# Patient Record
Sex: Female | Born: 1960 | Race: White | Hispanic: No | Marital: Married | Smoking: Current every day smoker
Health system: Southern US, Community
[De-identification: ages and names within clinical notes are randomized; demographics above are authoritative.]

## PROBLEM LIST (undated history)

## (undated) DIAGNOSIS — Z720 Tobacco use: Secondary | ICD-10-CM

## (undated) DIAGNOSIS — J329 Chronic sinusitis, unspecified: Secondary | ICD-10-CM

## (undated) DIAGNOSIS — R609 Edema, unspecified: Secondary | ICD-10-CM

## (undated) DIAGNOSIS — R600 Localized edema: Secondary | ICD-10-CM

## (undated) DIAGNOSIS — H6690 Otitis media, unspecified, unspecified ear: Secondary | ICD-10-CM

## (undated) HISTORY — PX: SHOULDER SURGERY: SHX246

## (undated) HISTORY — PX: KNEE SURGERY: SHX244

## (undated) HISTORY — PX: CHOLECYSTECTOMY: SHX55

## (undated) HISTORY — DX: Tobacco use: Z72.0

---

## 1999-10-11 ENCOUNTER — Encounter: Payer: Self-pay | Admitting: Family Medicine

## 1999-10-11 ENCOUNTER — Encounter: Admission: RE | Admit: 1999-10-11 | Discharge: 1999-10-11 | Payer: Self-pay | Admitting: Family Medicine

## 1999-10-19 ENCOUNTER — Encounter: Payer: Self-pay | Admitting: General Surgery

## 1999-10-19 ENCOUNTER — Observation Stay (HOSPITAL_COMMUNITY): Admission: RE | Admit: 1999-10-19 | Discharge: 1999-10-20 | Payer: Self-pay | Admitting: General Surgery

## 2000-03-10 ENCOUNTER — Ambulatory Visit (HOSPITAL_COMMUNITY): Admission: RE | Admit: 2000-03-10 | Discharge: 2000-03-10 | Payer: Self-pay | Admitting: General Surgery

## 2000-03-10 ENCOUNTER — Encounter: Payer: Self-pay | Admitting: General Surgery

## 2002-07-11 ENCOUNTER — Encounter: Admission: RE | Admit: 2002-07-11 | Discharge: 2002-07-11 | Payer: Self-pay | Admitting: Gastroenterology

## 2002-07-11 ENCOUNTER — Encounter: Payer: Self-pay | Admitting: Gastroenterology

## 2002-07-29 ENCOUNTER — Ambulatory Visit (HOSPITAL_COMMUNITY): Admission: RE | Admit: 2002-07-29 | Discharge: 2002-07-29 | Payer: Self-pay | Admitting: Gastroenterology

## 2002-10-23 ENCOUNTER — Encounter: Admission: RE | Admit: 2002-10-23 | Discharge: 2002-10-23 | Payer: Self-pay | Admitting: Internal Medicine

## 2002-10-23 ENCOUNTER — Encounter: Payer: Self-pay | Admitting: Internal Medicine

## 2003-03-18 ENCOUNTER — Encounter: Admission: RE | Admit: 2003-03-18 | Discharge: 2003-03-18 | Payer: Self-pay | Admitting: Gastroenterology

## 2003-03-18 ENCOUNTER — Encounter: Payer: Self-pay | Admitting: Gastroenterology

## 2003-05-30 ENCOUNTER — Ambulatory Visit (HOSPITAL_BASED_OUTPATIENT_CLINIC_OR_DEPARTMENT_OTHER): Admission: RE | Admit: 2003-05-30 | Discharge: 2003-05-30 | Payer: Self-pay | Admitting: Orthopedic Surgery

## 2005-05-17 ENCOUNTER — Encounter: Admission: RE | Admit: 2005-05-17 | Discharge: 2005-05-17 | Payer: Self-pay | Admitting: Internal Medicine

## 2005-08-26 ENCOUNTER — Ambulatory Visit (HOSPITAL_COMMUNITY): Admission: RE | Admit: 2005-08-26 | Discharge: 2005-08-26 | Payer: Self-pay | Admitting: *Deleted

## 2006-05-23 ENCOUNTER — Ambulatory Visit: Payer: Self-pay | Admitting: Oncology

## 2006-07-12 ENCOUNTER — Ambulatory Visit: Payer: Self-pay | Admitting: Oncology

## 2006-07-14 ENCOUNTER — Ambulatory Visit (HOSPITAL_BASED_OUTPATIENT_CLINIC_OR_DEPARTMENT_OTHER): Admission: RE | Admit: 2006-07-14 | Discharge: 2006-07-14 | Payer: Self-pay | Admitting: Orthopedic Surgery

## 2006-09-13 ENCOUNTER — Ambulatory Visit: Payer: Self-pay | Admitting: Oncology

## 2011-12-14 ENCOUNTER — Encounter (HOSPITAL_COMMUNITY): Payer: Self-pay | Admitting: Emergency Medicine

## 2011-12-14 ENCOUNTER — Emergency Department (HOSPITAL_COMMUNITY)
Admission: EM | Admit: 2011-12-14 | Discharge: 2011-12-14 | Disposition: A | Discharge status: Home / Self Care | Payer: Self-pay | Source: Home / Self Care | Attending: Emergency Medicine | Admitting: Emergency Medicine

## 2011-12-14 DIAGNOSIS — J329 Chronic sinusitis, unspecified: Secondary | ICD-10-CM

## 2011-12-14 DIAGNOSIS — J40 Bronchitis, not specified as acute or chronic: Secondary | ICD-10-CM

## 2011-12-14 HISTORY — DX: Localized edema: R60.0

## 2011-12-14 HISTORY — DX: Edema, unspecified: R60.9

## 2011-12-14 HISTORY — DX: Otitis media, unspecified, unspecified ear: H66.90

## 2011-12-14 HISTORY — DX: Chronic sinusitis, unspecified: J32.9

## 2011-12-14 MED ORDER — FLUTICASONE PROPIONATE 50 MCG/ACT NA SUSP: 2.0000 | Freq: Every day | NASAL | Status: DC

## 2011-12-14 MED ORDER — ALBUTEROL SULFATE HFA 108 (90 BASE) MCG/ACT IN AERS: 1.0000 | INHALATION_SPRAY | Freq: Four times a day (QID) | RESPIRATORY_TRACT | Status: DC | PRN

## 2011-12-14 MED ORDER — DEXAMETHASONE 4 MG PO TABS: 16.0000 mg | ORAL_TABLET | Freq: Two times a day (BID) | ORAL | Status: DC

## 2011-12-14 MED ORDER — DOXYCYCLINE HYCLATE 100 MG PO CAPS: 100.0000 mg | ORAL_CAPSULE | Freq: Two times a day (BID) | ORAL | Status: AC

## 2011-12-14 MED ORDER — DEXAMETHASONE 4 MG PO TABS: ORAL_TABLET | ORAL | Status: DC

## 2011-12-14 MED ORDER — HYDROCODONE-ACETAMINOPHEN 7.5-500 MG/15ML PO SOLN: 5.0000 mL | Freq: Four times a day (QID) | ORAL | Status: AC | PRN

## 2011-12-14 NOTE — ED Notes (Signed)
C/o of cold type symptoms  Since mothers day. Treated 3 weeks ago for a sinus infection

## 2011-12-14 NOTE — Discharge Instructions (Signed)
Take the medication as written. Return if you get worse, have a fever >100.4, or for any concerns. You may take 600 mg of motrin with 1 gram of tylenol up to 4 times a day as needed for pain. This is an effective combination for pain. Use a neti pot or the NeilMed sinus rinse as often as you want to to reduce nasal congestion. Follow the directions on the box.  ° °Go to www.goodrx.com to look up your medications. This will give you a list of where you can find your prescriptions at the most affordable prices.  °

## 2011-12-18 NOTE — ED Provider Notes (Signed)
History     CSN: [id number]  Arrival date & time 12/14/11  1955   First MD Initiated Contact with Patient 12/14/11 2010      Chief Complaint  Patient presents with  . URI    (Consider location/radiation/quality/duration/timing/severity/associated sxs/prior treatment) HPI Comments: Patient reports rhinorrhea, nasal congestion, frontal sinus headache, sinus pain/pressure, postnasal drip, sore, irritated throat starting 5 days ago. Also with chest congestion, coughing, wheezing, some shortness of breath with exertion. Difficulty sleeping at night due to coughing. No ear pain. No nausea, vomiting, chest pain.Marland Kitchen No photophobia, visual changes, neck stiffness. No fevers at home. No aggravating or alleviating factors. Has been trying over-the-counter antihistamines and cold medications without relief. She was recently treated for sinus infection, finished antibiotics 3 weeks ago. States she felt like she never got better. States this feels identical to this recent episode. Patient is a smoker.  ROS as noted in HPI. All other ROS negative.    Patient is a 51 y.o. female presenting with URI. The history is provided by the patient.  URI    Past Medical History  Diagnosis Date  . Peripheral edema   . Sinusitis   . Otitis media     Past Surgical History  Procedure Date  . Cholecystectomy   . Shoulder surgery   . Knee surgery     History reviewed. No pertinent family history.  History  Substance Use Topics  . Smoking status: Current Everyday Smoker -- 1.0 packs/day  . Smokeless tobacco: Not on file  . Alcohol Use: Yes    OB History    Grav Para Term Preterm Abortions TAB SAB Ect Mult Living                  Review of Systems  Allergies  Review of patient's allergies indicates no known allergies.  Home Medications   Current Outpatient Rx  Name Route Sig Dispense Refill  . FUROSEMIDE 20 MG PO TABS Oral Take 20 mg by mouth 2 (two) times daily.    Marland Kitchen  PSEUDOEPHEDRINE-GUAIFENESIN ER [phone] MG PO TB12 Oral Take by mouth.    . ALBUTEROL SULFATE HFA 108 (90 BASE) MCG/ACT IN AERS Inhalation Inhale 1-2 puffs into the lungs every 6 (six) hours as needed for wheezing. 1 Inhaler 0  . DEXAMETHASONE 4 MG PO TABS  4 tabs (16 mg) po at once on day one, 4 tabs (16 mg) po at once on day 2 8 tablet 0  . DOXYCYCLINE HYCLATE 100 MG PO CAPS Oral Take 1 capsule (100 mg total) by mouth 2 (two) times daily. X 7 days 14 capsule 0  . FLUTICASONE PROPIONATE 50 MCG/ACT NA SUSP Nasal Place 2 sprays into the nose daily. 16 g 2  . HYDROCODONE-ACETAMINOPHEN 7.5-500 MG/15ML PO SOLN Oral Take 5 mLs by mouth every 6 (six) hours as needed for pain. 120 mL 0    BP 139/80  Pulse 94  Temp(Src) 100.3 F (37.9 C) (Oral)  Resp 18  SpO2 97%  Physical Exam  Nursing note and vitals reviewed. Constitutional: She is oriented to person, place, and time. She appears well-developed and well-nourished.  HENT:  Head: Normocephalic and atraumatic.  Right Ear: Tympanic membrane and ear canal normal.  Left Ear: Tympanic membrane and ear canal normal.  Nose: Mucosal edema and rhinorrhea present.  Mouth/Throat: Uvula is midline and mucous membranes are normal. Posterior oropharyngeal erythema present. No oropharyngeal exudate.       (+) frontal, maxillary sinus tenderness, purulent nasal drainage  Eyes: Conjunctivae and EOM are normal. Pupils are equal, round, and reactive to light.  Neck: Normal range of motion. Neck supple.  Cardiovascular: Normal rate, regular rhythm and normal heart sounds.   Pulmonary/Chest: Effort normal. No respiratory distress. She has wheezes. She has no rales. She exhibits no tenderness.  Abdominal: Soft. Bowel sounds are normal. She exhibits no distension.  Musculoskeletal: Normal range of motion.  Lymphadenopathy:    She has no cervical adenopathy.  Neurological: She is alert and oriented to person, place, and time.  Skin: Skin is warm and dry. No rash  noted.  Psychiatric: She has a normal mood and affect. Her behavior is normal. Judgment and thought content normal.    ED Course  Procedures (including critical care time)  Labs Reviewed - No data to display No results found.   1. Sinusitis   2. Bronchitis     MDM  Since patient is a smoker, we'll send her home with antibiotics for her bronchitis. Will cover for sinus infection at the same time. Home with broncho-dilators, short course of steroids, antitussives. Will also start her with Flonase, Mucinex D, saline nasal irrigation. For her sinus infection. Discussed signs and symptoms that should prompt her return to the ER. Patient agrees with plan. She is to follow with her PMD, Dr. Everlene Other, if no improvement in several days.  Luiz Blare, MD 12/18/11 2146

## 2012-11-12 DIAGNOSIS — B009 Herpesviral infection, unspecified: Secondary | ICD-10-CM | POA: Insufficient documentation

## 2013-03-06 ENCOUNTER — Other Ambulatory Visit: Payer: Self-pay | Admitting: Gastroenterology

## 2013-03-29 DIAGNOSIS — K219 Gastro-esophageal reflux disease without esophagitis: Secondary | ICD-10-CM | POA: Insufficient documentation

## 2013-03-29 DIAGNOSIS — F17200 Nicotine dependence, unspecified, uncomplicated: Secondary | ICD-10-CM | POA: Insufficient documentation

## 2013-03-29 DIAGNOSIS — F988 Other specified behavioral and emotional disorders with onset usually occurring in childhood and adolescence: Secondary | ICD-10-CM | POA: Insufficient documentation

## 2013-04-18 DIAGNOSIS — I872 Venous insufficiency (chronic) (peripheral): Secondary | ICD-10-CM | POA: Insufficient documentation

## 2015-06-23 DIAGNOSIS — R635 Abnormal weight gain: Secondary | ICD-10-CM | POA: Insufficient documentation

## 2015-06-23 DIAGNOSIS — Z0000 Encounter for general adult medical examination without abnormal findings: Secondary | ICD-10-CM | POA: Insufficient documentation

## 2016-02-12 ENCOUNTER — Encounter: Payer: Self-pay | Admitting: Genetic Counselor

## 2016-12-06 ENCOUNTER — Encounter: Payer: Self-pay | Admitting: Podiatry

## 2016-12-06 ENCOUNTER — Ambulatory Visit (INDEPENDENT_AMBULATORY_CARE_PROVIDER_SITE_OTHER): Payer: 59 | Admitting: Podiatry

## 2016-12-06 ENCOUNTER — Ambulatory Visit (INDEPENDENT_AMBULATORY_CARE_PROVIDER_SITE_OTHER): Payer: 59

## 2016-12-06 DIAGNOSIS — L603 Nail dystrophy: Secondary | ICD-10-CM

## 2016-12-06 DIAGNOSIS — M2041 Other hammer toe(s) (acquired), right foot: Secondary | ICD-10-CM

## 2016-12-06 DIAGNOSIS — M722 Plantar fascial fibromatosis: Secondary | ICD-10-CM

## 2016-12-06 MED ORDER — METHYLPREDNISOLONE 4 MG PO TBPK: ORAL_TABLET | ORAL | 0 refills | Status: DC

## 2016-12-06 MED ORDER — MELOXICAM 15 MG PO TABS: 15.0000 mg | ORAL_TABLET | Freq: Every day | ORAL | 3 refills | Status: DC

## 2016-12-06 NOTE — Progress Notes (Signed)
   Subjective:    Patient ID: Kimberly Hoover, female    DOB: 10/29/1960, 56 y.o.   MRN: [id number]  HPI: She presents today for chief complaint of pain to the left heel. He is concerned about the discoloration of the toenails she is more concerned about the pain of the heels. She states is been going on for several months she initially noticed she noticed the pain posteriorly but now has come around side to the plantar medial aspect of the left heel. Point pain is particularly bad she's tried stretching icing massage therapy no help. So also concerned about her hammertoes.    Review of Systems  Musculoskeletal: Positive for back pain.  All other systems reviewed and are negative.      Objective:   Physical Exam: Vital signs are stable she is alert and oriented 3. Pulses are palpable. Neurologic sensorium is intact. The tendon reflexes are intact and muscle strength is all bilateral. Orthopedic evaluation of strength all joints distal to the ankle full range of motion without crepitation. Possible hammertoe deformities are noted bilaterally she does have pain on palpation medially continue to go the right heel. She also has thickened discolored toenails to the hallux right greater than left and multiple other nail plates. No open lesions or wounds are noted.        Assessment & Plan:  Assessment: Plantar fasciitis left foot. Nail dystrophy bilateral.  Plan: Take sample of the nail and skin sent for pathologic evaluation today also injected the left heel today with Kenalog and local anesthetic posterior plantar fascia brace and a night splint. Discussed further judicious use ice near future medications started her on Medrol Dosepak to be followed by meloxicam. She was provided with both oral and home going instructions for care and stretching of her plantar fascia. Follow-up with her in 1 month

## 2016-12-06 NOTE — Patient Instructions (Signed)

## 2016-12-23 ENCOUNTER — Telehealth: Payer: Self-pay | Admitting: *Deleted

## 2016-12-23 NOTE — Telephone Encounter (Addendum)
-----   Message from Elinor ParkinsonMax T Hyatt, [place] sent at 12/22/2016  6:21 PM EDT ----- Negative for fungus.12/23/2016-I informed pt of the results and pt asked what could be done for the appearance. I told her that if the toenail was thick, urea could be applied daily to soften the nail for easier trimming and filing thinner. I informed pt of Revitaderm40 and use. Pt states she will pick up today.

## 2016-12-23 NOTE — Telephone Encounter (Signed)
Left message not to come to the [place] office for the Revitaderm40, that we are currently out,but I would have the girls at reception contact when in stock.

## 2017-01-03 ENCOUNTER — Ambulatory Visit (INDEPENDENT_AMBULATORY_CARE_PROVIDER_SITE_OTHER): Payer: 59 | Admitting: Podiatry

## 2017-01-03 ENCOUNTER — Encounter: Payer: Self-pay | Admitting: Podiatry

## 2017-01-03 DIAGNOSIS — M722 Plantar fascial fibromatosis: Secondary | ICD-10-CM | POA: Diagnosis not present

## 2017-01-04 NOTE — Progress Notes (Signed)
She resisted for follow-up of plantar fasciitis she states is doing much better.  Objective: Vital signs are stable she is alert and oriented 3 pulses are palpable no reproducible pain on palpation of the left heel.  Assessment: Plantar fasciitis resolved 100%.  Plan: I encouraged her to continue all conservative therapies including braces and anti-inflammatories and she will gear change. I will follow up with her in 1 month with any regression. Otherwise I'll follow up with her as needed

## 2017-01-26 ENCOUNTER — Encounter: Payer: Self-pay | Admitting: Podiatry

## 2017-01-26 ENCOUNTER — Ambulatory Visit (INDEPENDENT_AMBULATORY_CARE_PROVIDER_SITE_OTHER): Payer: 59

## 2017-01-26 ENCOUNTER — Ambulatory Visit (INDEPENDENT_AMBULATORY_CARE_PROVIDER_SITE_OTHER): Payer: 59 | Admitting: Podiatry

## 2017-01-26 VITALS — BP 123/77 | HR 69

## 2017-01-26 DIAGNOSIS — S92514A Nondisplaced fracture of proximal phalanx of right lesser toe(s), initial encounter for closed fracture: Secondary | ICD-10-CM

## 2017-01-26 DIAGNOSIS — R52 Pain, unspecified: Secondary | ICD-10-CM

## 2017-01-26 NOTE — Patient Instructions (Signed)
Toe Fracture A toe fracture is a break in one of the toe bones (phalanges). Follow these instructions at home: If you have a cast:  Do not stick anything inside the cast to scratch your skin.  Check the skin around the cast every day. Tell your doctor about any concerns. Do not put lotion on the skin underneath the cast. You may put lotion on dry skin around the edges of the cast.  Do not put pressure on any part of the cast until it is fully hardened. This may take many hours.  Keep the cast clean and dry. Bathing  Do not take baths, swim, or use a hot tub until your doctor says that you can. Ask your doctor if you can take showers. You may only be allowed to take sponge baths for bathing.  If your doctor says that bathing and showering are okay, cover the cast or bandage (dressing) with a watertight plastic bag to protect it from water. Do not let the cast or bandage get wet. Managing pain, stiffness, and swelling  If you do not have a cast, put ice on the injured area if told by your doctor: ? Put ice in a plastic bag. ? Place a towel between your skin and the bag. ? Leave the ice on for 20 minutes, 2-3 times per day.  Move your toes often to avoid stiffness and to lessen swelling.  Raise (elevate) the injured area above the level of your heart while you are sitting or lying down. Driving  Do not drive or use heavy machinery while taking pain medicine.  Do not drive while wearing a cast on a foot that you use for driving. Activity  Return to your normal activities as told by your doctor. Ask your doctor what activities are safe for you.  Perform exercises daily as told by your doctor or therapist. Safety  Do not use your leg to support your body weight until your doctor says that you can. Use crutches or other tools to help you move around as told by your doctor. General instructions  If your toe was taped to a toe that is next to it (buddy taping), follow your doctor's  instructions for changing the gauze and tape. Change it more often: ? If the gauze and tape get wet. If this happens, dry the space between the toes. ? If the gauze and tape are too tight and they cause your toe to become pale or to lose feeling (numb).  Wear a protective shoe as told by your doctor. If you were not given one, wear sturdy shoes that support your foot. Your shoes should not pinch your toes. Your shoes should not fit tightly against your toes.  Do not use any tobacco products, including cigarettes, chewing tobacco, or e-cigarettes. Tobacco can delay bone healing. If you need help quitting, ask your doctor.  Take medicines only as told by your doctor.  Keep all follow-up visits as told by your doctor. This is important. Contact a doctor if:  You have a fever.  Your pain medicine is not helping.  Your toe feels cold.  You lose feeling (have numbness) in your toe.  You still have pain after one week of rest and treatment.  You still have pain after your doctor has said that you can start walking again.  You have pain or tingling in your foot, and it is not going away.  You have loss of feeling in your foot, and it is   not going away. Get help right away if:  You have severe pain.  You have redness or swelling (inflammation) in your toe, and it is getting worse.  You have pain or loss of feeling in your toe, and it is getting worse.  Your toe is blue. This information is not intended to replace advice given to you by your health care provider. Make sure you discuss any questions you have with your health care provider. Document Released: 01/04/2008 Document Revised: 03/21/2016 Document Reviewed: 05/14/2014 Elsevier Interactive Patient Education  2018 Elsevier Inc.  

## 2017-01-27 DIAGNOSIS — S92514A Nondisplaced fracture of proximal phalanx of right lesser toe(s), initial encounter for closed fracture: Secondary | ICD-10-CM | POA: Insufficient documentation

## 2017-01-27 NOTE — Progress Notes (Signed)
Subjective: 56 year old female presents the office today for concerns of right fourth toe pain. She states that she hit this toe about 2 weeks ago and her toes turn purple and became very swollen. She states that although the color has come back and still swollen and painful to move the toe. She did try taping the toe but does cause a lot of pain. She presents today for evaluation. She's had no recent treatment otherwise since the injury. Denies any systemic complaints such as fevers, chills, nausea, vomiting. No acute changes since last appointment, and no other complaints at this time.   Objective: AAO x3, NAD DP/PT pulses palpable bilaterally, CRT less than 3 seconds There is edema to the right fourth toe next to palpation of the toe. There is minimal tenderness along the fourth metatarsal, however the majority of tenderness of the digit. There is no other area pinpoint bony tenderness or pain the vibratory sensation.  No open lesions or pre-ulcerative lesions.  No pain with calf compression, swelling, warmth, erythema  Assessment: Right fourth toe fracture  Plan: -All treatment options discussed with the patient including all alternatives, risks, complications.  -X-rays were obtained and reviewed which reveal an oblique fracture present within the right fourth toe. -Given her pain level surgical shoe was dispensed. -Ice and elevation limited activity. - RTC 3 weeks or sooner if needed.  -Patient encouraged to call the office with any questions, concerns, change in symptoms.   *x-ray next appointment right foot  Ovid CurdMatthew Wagoner, DPM

## 2017-02-02 ENCOUNTER — Encounter (INDEPENDENT_AMBULATORY_CARE_PROVIDER_SITE_OTHER): Payer: 59 | Admitting: Podiatry

## 2017-02-02 NOTE — Progress Notes (Signed)
This encounter was created in error - please disregard.

## 2017-02-15 DIAGNOSIS — Z872 Personal history of diseases of the skin and subcutaneous tissue: Secondary | ICD-10-CM | POA: Insufficient documentation

## 2017-02-15 DIAGNOSIS — Z23 Encounter for immunization: Secondary | ICD-10-CM | POA: Insufficient documentation

## 2017-02-15 DIAGNOSIS — Z1159 Encounter for screening for other viral diseases: Secondary | ICD-10-CM | POA: Insufficient documentation

## 2017-02-15 DIAGNOSIS — Z889 Allergy status to unspecified drugs, medicaments and biological substances status: Secondary | ICD-10-CM | POA: Insufficient documentation

## 2017-02-16 ENCOUNTER — Encounter: Payer: Self-pay | Admitting: Podiatry

## 2017-02-16 ENCOUNTER — Ambulatory Visit (INDEPENDENT_AMBULATORY_CARE_PROVIDER_SITE_OTHER): Payer: 59

## 2017-02-16 ENCOUNTER — Ambulatory Visit (INDEPENDENT_AMBULATORY_CARE_PROVIDER_SITE_OTHER): Payer: 59 | Admitting: Podiatry

## 2017-02-16 DIAGNOSIS — S92514D Nondisplaced fracture of proximal phalanx of right lesser toe(s), subsequent encounter for fracture with routine healing: Secondary | ICD-10-CM | POA: Diagnosis not present

## 2017-02-16 DIAGNOSIS — M722 Plantar fascial fibromatosis: Secondary | ICD-10-CM

## 2017-02-16 NOTE — Progress Notes (Signed)
She presents today for follow-up of her fractured fourth toe of the right foot. She states is seems to be doing some better. She is requesting an injection for her left heel which has started a more painful once again.  Objective: Vital signs are stable alert and oriented 3. Pulses are palpable. She has pain on palpation fourth toe right foot radiographs do demonstrate healing fracture in good alignment. 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  Assessment: Healing fracture fourth toe right plantar fasciitis left.  Plan: Injected the left heel today with Kenalog and local anesthetic encouraged her to wear her Darco shoe versus a regular shoe for the right foot. All with her as needed.

## 2017-08-15 ENCOUNTER — Encounter: Payer: Self-pay | Admitting: Podiatry

## 2017-08-15 ENCOUNTER — Ambulatory Visit: Payer: 59 | Admitting: Podiatry

## 2017-08-15 DIAGNOSIS — M722 Plantar fascial fibromatosis: Secondary | ICD-10-CM | POA: Diagnosis not present

## 2017-08-15 NOTE — Progress Notes (Signed)
She presents today for follow-up of left heel pain started bothering her again just a few weeks ago.  Objective: Vital signs are stable she is alert and oriented x3 pain to palpation medial calcaneal tubercle of the left heel.  Pulses remain palpable no calf pain.  Assessment: Plantar fasciitis of the left heel.  Plan: After sterile Betadine skin prep and verbal consent we reinjected the left heel today with Kenalog and local anesthetic 20 mg and 5 mg respectively.  To the point of maximal tenderness of the left foot.  Follow-up with me in 1 month.

## 2018-02-20 ENCOUNTER — Encounter

## 2018-02-20 ENCOUNTER — Encounter: Payer: Self-pay | Admitting: Podiatry

## 2018-02-20 ENCOUNTER — Telehealth: Payer: Self-pay | Admitting: *Deleted

## 2018-02-20 ENCOUNTER — Ambulatory Visit: Payer: 59 | Admitting: Podiatry

## 2018-02-20 DIAGNOSIS — S86312D Strain of muscle(s) and tendon(s) of peroneal muscle group at lower leg level, left leg, subsequent encounter: Secondary | ICD-10-CM

## 2018-02-20 DIAGNOSIS — M722 Plantar fascial fibromatosis: Secondary | ICD-10-CM

## 2018-02-20 NOTE — Progress Notes (Signed)
She presents today chief complaint of severe painful heel x3 months left she states that this is been going on for over a year but has really been horrible for the past 3 months it seems to really be a lot of swelling on the lateral aspect of the foot and is hard for me to move my foot.  Objective: Vital signs stable alert and oriented x3.  Pulses are palpable.  Considerable edema of the lateral aspect of the foot from the fourth fifth tarsometatarsal joint fifth metatarsal base and on palpation of the peroneal tendons there is considerable soreness.  She is very weak on lateral motion such as abduction and abduction with plantarflexion of the forefoot.  She has pain on palpation medial calcaneal tubercle of the left heel severe in nature.  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  Plan: Discussed etiology pathology conservative surgical therapies at this point I feel it necessary that we obtain an MRI for possible surgical evaluation and correction of her left peroneal's as well as the plantar fascia which is chronic.  I will follow-up with her once that has come in and we will discuss surgical intervention.

## 2018-02-20 NOTE — Telephone Encounter (Signed)
-----   Message from Kristian Coveyshley E Prevette, [facility] sent at 02/20/2018  9:12 AM EDT ----- Regarding: MRI MRI rearfoot (heel) left - evaluate peroneal tendon tear left and plantar fasciitis left - surgical consideration

## 2018-02-20 NOTE — Telephone Encounter (Signed)
Orders given to J. Quintana, RN for pre-cert, faxed to [facility]. 

## 2018-02-25 ENCOUNTER — Ambulatory Visit
Admission: RE | Admit: 2018-02-25 | Discharge: 2018-02-25 | Disposition: A | Discharge status: Home / Self Care | Payer: 59 | Source: Ambulatory Visit | Attending: Podiatry | Admitting: Podiatry

## 2018-02-26 NOTE — Telephone Encounter (Signed)
-----   Message from Elinor ParkinsonMax T Hyatt, [place] sent at 02/26/2018 11:56 AM EDT ----- Please send for over read and inform the patient of the delay.

## 2018-02-26 NOTE — Telephone Encounter (Signed)
I informed pt of Dr. Geryl RankinsHyatt's review of results and request to send a copy of the MRI disc to a radiology specialist for more details for treatment planning.

## 2018-03-01 NOTE — Telephone Encounter (Signed)
Left message [facility] Records to mail copy of MRI disc to our office.

## 2018-03-02 NOTE — Telephone Encounter (Signed)
Received copy of MRI disc from [facility] and mailed to [facility]. 

## 2018-03-14 ENCOUNTER — Encounter: Payer: Self-pay | Admitting: Podiatry

## 2018-03-15 ENCOUNTER — Ambulatory Visit: Payer: 59 | Admitting: Podiatry

## 2018-03-15 DIAGNOSIS — M722 Plantar fascial fibromatosis: Secondary | ICD-10-CM

## 2018-03-15 DIAGNOSIS — M66872 Spontaneous rupture of other tendons, left ankle and foot: Secondary | ICD-10-CM | POA: Diagnosis not present

## 2018-03-15 NOTE — Patient Instructions (Signed)
Pre-Operative Instructions  Congratulations, you have decided to take an important step towards improving your quality of life.  You can be assured that the doctors and staff at [facility] will be with you every step of the way.  Here are some important things you should know:  1. Plan to be at the surgery center/hospital at least 1 (one) hour prior to your scheduled time, unless otherwise directed by the surgical center/hospital staff.  You must have a responsible adult accompany you, remain during the surgery and drive you home.  Make sure you have directions to the surgical center/hospital to ensure you arrive on time. 2. If you are having surgery at [facility] or [facility] hospitals, you will need a copy of your medical history and physical form from your family physician within one month prior to the date of surgery. We will give you a form for your primary physician to complete.  3. We make every effort to accommodate the date you request for surgery.  However, there are times where surgery dates or times have to be moved.  We will contact you as soon as possible if a change in schedule is required.   4. No aspirin/ibuprofen for one week before surgery.  If you are on aspirin, any non-steroidal anti-inflammatory medications (Mobic, Aleve, Ibuprofen) should not be taken seven (7) days prior to your surgery.  You make take Tylenol for pain prior to surgery.  5. Medications - If you are taking daily heart and blood pressure medications, seizure, reflux, allergy, asthma, anxiety, pain or diabetes medications, make sure you notify the surgery center/hospital before the day of surgery so they can tell you which medications you should take or avoid the day of surgery. 6. No food or drink after midnight the night before surgery unless directed otherwise by surgical center/hospital staff. 7. No alcoholic beverages 24-hours prior to surgery.  No smoking 24-hours prior or 24-hours after  surgery. 8. Wear loose pants or shorts. They should be loose enough to fit over bandages, boots, and casts. 9. Don't wear slip-on shoes. Sneakers are preferred. 10. Bring your boot with you to the surgery center/hospital.  Also bring crutches or a walker if your physician has prescribed it for you.  If you do not have this equipment, it will be provided for you after surgery. 11. If you have not been contacted by the surgery center/hospital by the day before your surgery, call to confirm the date and time of your surgery. 12. Leave-time from work may vary depending on the type of surgery you have.  Appropriate arrangements should be made prior to surgery with your employer. 13. Prescriptions will be provided immediately following surgery by your doctor.  Fill these as soon as possible after surgery and take the medication as directed. Pain medications will not be refilled on weekends and must be approved by the doctor. 14. Remove nail polish on the operative foot and avoid getting pedicures prior to surgery. 15. Wash the night before surgery.  The night before surgery wash the foot and leg well with water and the antibacterial soap provided. Be sure to pay special attention to beneath the toenails and in between the toes.  Wash for at least three (3) minutes. Rinse thoroughly with water and dry well with a towel.  Perform this wash unless told not to do so by your physician.  Enclosed: 1 Ice pack (please put in freezer the night before surgery)   1 Hibiclens skin cleaner     Pre-op instructions  If you have any questions regarding the instructions, please do not hesitate to call our office.  [place]: [address], [place], [place] [address] -- [phone]  [place]: [address] [place], [place] [address] -- [phone]  [place]: [address]  [place], [place] [address] -- [phone]  [place]: [address], [address], [place], [place] [address] -- [phone]  Website:  [email or web address] 

## 2018-03-17 NOTE — Progress Notes (Signed)
She presents today for follow-up of her left foot states that her left foot feels still feels the same if not any better definitely worse.  She states that is affecting her ability to perform her daily activities of life and she just cannot continue on with it like this.  Objective: Vital signs are stable she is alert and oriented x3.  Pulses are palpable.  Neurologic sensorium is intact deep tendon reflexes are intact muscle strength is normal symmetrical.  Orthopedic evaluation demonstrates all joints distal ankle full range of motion without crepitation.  She has severe pain on palpation with fluctuance on inversion of the posterior tibial tendon left it is swollen and tender.  She also has severe pain on palpation medial calcaneal tubercle.  MRI does demonstrate plantar fasciitis and near rupture of the posterior tibial tendon.  Assessment: Plantar fasciitis and a rupture of the posterior tibial tendon left.  Plan: Discussed etiology pathology conservative surgical therapies.  At this point we discussed surgical correction which will consist of a Kidner procedure and a posterior tibial tendon repair as well as an endoscopic plantar fasciotomy and cast application.  She understands that she will be out of work for a while she will also continue to keep foot elevated and dry until told otherwise.  We discussed the possible postop complications that occur with this procedure which may include but are not limited to postop pain bleeding swelling infection recurrence need for further surgery overcorrection under correction loss of digit loss of limb loss of life.  She signed all of the consent forms today she was provided with both oral and written home-going instructions for her preop instructions as well as surgery center information and anesthesia information.  She will follow-up with us in the near future for surgical intervention.

## 2018-04-09 ENCOUNTER — Telehealth: Payer: Self-pay | Admitting: *Deleted

## 2018-04-09 NOTE — Telephone Encounter (Signed)
"  I received a message to give you a call."  Yes, I was calling to see if you wanted to move your surgery up to this Friday.  Dr. Al Corpus had a cancellation.  "No, I think I will leave it where it is, especially since school started back.  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  Thanks for giving me a call."  You are welcome.

## 2018-04-09 NOTE — Telephone Encounter (Addendum)
I attempted to call the patient to see if she would like to reschedule her surgery from 05/04/2018 to 04/13/2018.  I left her a message to call me back.

## 2018-04-10 NOTE — Telephone Encounter (Signed)
Thanks Delydia.  Just leave this Friday as is.  I have to leave town early Friday.

## 2018-04-16 DIAGNOSIS — M810 Age-related osteoporosis without current pathological fracture: Secondary | ICD-10-CM | POA: Insufficient documentation

## 2018-05-02 ENCOUNTER — Other Ambulatory Visit: Payer: Self-pay | Admitting: Podiatry

## 2018-05-02 MED ORDER — CEPHALEXIN 500 MG PO CAPS: 500.0000 mg | ORAL_CAPSULE | Freq: Three times a day (TID) | ORAL | 0 refills | Status: DC

## 2018-05-02 MED ORDER — ONDANSETRON HCL 4 MG PO TABS: 4.0000 mg | ORAL_TABLET | Freq: Three times a day (TID) | ORAL | 0 refills | Status: DC | PRN

## 2018-05-02 MED ORDER — OXYCODONE-ACETAMINOPHEN 10-325 MG PO TABS: 1.0000 | ORAL_TABLET | Freq: Four times a day (QID) | ORAL | 0 refills | Status: AC | PRN

## 2018-05-04 ENCOUNTER — Encounter: Payer: Self-pay | Admitting: Podiatry

## 2018-05-04 DIAGNOSIS — M216X2 Other acquired deformities of left foot: Secondary | ICD-10-CM

## 2018-05-04 DIAGNOSIS — G5752 Tarsal tunnel syndrome, left lower limb: Secondary | ICD-10-CM

## 2018-05-04 DIAGNOSIS — M66872 Spontaneous rupture of other tendons, left ankle and foot: Secondary | ICD-10-CM

## 2018-05-10 ENCOUNTER — Ambulatory Visit (INDEPENDENT_AMBULATORY_CARE_PROVIDER_SITE_OTHER): Payer: Self-pay | Admitting: Podiatry

## 2018-05-10 VITALS — BP 122/79 | HR 74 | Temp 97.0°F

## 2018-05-10 DIAGNOSIS — M66872 Spontaneous rupture of other tendons, left ankle and foot: Secondary | ICD-10-CM

## 2018-05-10 DIAGNOSIS — S86312D Strain of muscle(s) and tendon(s) of peroneal muscle group at lower leg level, left leg, subsequent encounter: Secondary | ICD-10-CM

## 2018-05-10 NOTE — Progress Notes (Signed)
She presents today 1 week status post repair posterior tibial tendon left with transfer of the flexor digitorum longus to the navicular tuberosity.  Also an endoscopic plantar fasciotomy.  She states that everything seems to be doing fine she is taking ibuprofen as needed.  Has calf pain back pain chest pain shortness of breath and headache.  Objective: Presents today cast intact using a knee scooter.  Vital signs are stable alert and oriented x3.  Pulses are palpable in the popliteal fossa capillary fill time to digits 1 through 5 of the left foot is immediate she has good range of motion of the toes.  Assessment well-healing surgical foot x1 week.  Plan: Follow-up with her in 1 week for cast removal and reapplication.  Call with questions or concerns.

## 2018-05-17 ENCOUNTER — Ambulatory Visit (INDEPENDENT_AMBULATORY_CARE_PROVIDER_SITE_OTHER): Payer: 59 | Admitting: Podiatry

## 2018-05-17 VITALS — Temp 98.7°F

## 2018-05-17 DIAGNOSIS — S86312D Strain of muscle(s) and tendon(s) of peroneal muscle group at lower leg level, left leg, subsequent encounter: Secondary | ICD-10-CM | POA: Diagnosis not present

## 2018-05-17 DIAGNOSIS — M66872 Spontaneous rupture of other tendons, left ankle and foot: Secondary | ICD-10-CM

## 2018-05-17 NOTE — Progress Notes (Signed)
She presents today date of surgery 05/04/2018 EPF left repair posterior tibial tendon left kidney advanced tendon cast left she states that her foot or leg feels okay she denies fever chills nausea vomiting muscle aches pains calf pain back pain chest pain shortness of breath.  Objective: Cast was intact clean and dry removed demonstrates no erythema edema cellulitis drainage or odor staples are in place sutures are in place and she is able to move the foot for the first time in months without pain.  Assessment: Well-healing surgical foot.  Plan: Redressed the foot today repeat cast today we will follow-up with her in 2 weeks at which time we will take the cast off and put her in a cam walker still nonweightbearing.

## 2018-05-31 ENCOUNTER — Ambulatory Visit (INDEPENDENT_AMBULATORY_CARE_PROVIDER_SITE_OTHER): Payer: 59 | Admitting: Podiatry

## 2018-05-31 DIAGNOSIS — M66872 Spontaneous rupture of other tendons, left ankle and foot: Secondary | ICD-10-CM | POA: Diagnosis not present

## 2018-05-31 DIAGNOSIS — Z09 Encounter for follow-up examination after completed treatment for conditions other than malignant neoplasm: Secondary | ICD-10-CM

## 2018-05-31 NOTE — Progress Notes (Signed)
She presents today date of surgery 05/04/2018 status post EPF left gastroc recession posterior tibial tendon repair with a Kidner advanced tendon repair.  States that the left foot really feels great much better than it did prior surgery she states that she takes Motrin as needed.  Objective: Vital signs are stable she is alert and oriented x3 presents with a dry cast and clean cast today to the left lower extremity.  Was removed demonstrates dry sterile dressing intact was removed demonstrates sutures are intact margins well coapted no erythema edema saline strange odor went ahead and removed all of the staples and sutures today everything stayed coapted.  She has good inversion against resistance with mild tenderness.  She has no calf pain she has good plantar flexion against resistance.  Assessment: Well-healing surgical foot.  Plan: Place her in a cam walker nonweightbearing for the next 2 weeks and then will progress to partial weightbearing on the third week finally progressing to weightbearing by the fourth week.  I will follow-up with her at that time.

## 2018-06-14 ENCOUNTER — Other Ambulatory Visit: Payer: 59

## 2018-06-19 ENCOUNTER — Ambulatory Visit (INDEPENDENT_AMBULATORY_CARE_PROVIDER_SITE_OTHER): Payer: Self-pay | Admitting: Podiatry

## 2018-06-19 DIAGNOSIS — M722 Plantar fascial fibromatosis: Secondary | ICD-10-CM

## 2018-06-19 DIAGNOSIS — Z9889 Other specified postprocedural states: Secondary | ICD-10-CM

## 2018-06-19 DIAGNOSIS — M66872 Spontaneous rupture of other tendons, left ankle and foot: Secondary | ICD-10-CM

## 2018-06-19 NOTE — Progress Notes (Signed)
She presents today date of surgery 05/04/2018 status post repair posterior tibial tendon left and an endoscopic plantar fasciotomy.  States that the endoscopic area hurts worse than anything else states that she is only been partial weightbearing since last week.  Objective: Vital signs are stable she is alert and oriented x3.  She has no tenderness on palpation of the posterior tibial tendon incision site though she does have tenderness on palpation of the plantar aspect of the foot where the fasciotomy was performed.  Assessment: Endoscopic plantar fasciotomy and Kidner procedure with peroneal tendon repair healing well.  Plan: Encourage massage therapy to the plantar aspect of the foot.  Encourage range of motion and muscle exercises to the foot.  Plan: Encouraged her to start ambulating without crutches over the next couple weeks we will follow-up with her in 2 to 3 weeks.

## 2018-07-17 ENCOUNTER — Ambulatory Visit (INDEPENDENT_AMBULATORY_CARE_PROVIDER_SITE_OTHER): Payer: 59 | Admitting: Podiatry

## 2018-07-17 ENCOUNTER — Encounter: Payer: Self-pay | Admitting: Podiatry

## 2018-07-17 DIAGNOSIS — Z9889 Other specified postprocedural states: Secondary | ICD-10-CM

## 2018-07-17 DIAGNOSIS — M66872 Spontaneous rupture of other tendons, left ankle and foot: Secondary | ICD-10-CM

## 2018-07-17 DIAGNOSIS — M722 Plantar fascial fibromatosis: Secondary | ICD-10-CM

## 2018-07-17 MED ORDER — METHYLPREDNISOLONE 4 MG PO TBPK: ORAL_TABLET | ORAL | 0 refills | Status: DC

## 2018-07-17 NOTE — Progress Notes (Signed)
She presents today for postop visit date of surgery 05/04/2018 status post EPF and posterior tibial tendon repair.  She states that the tendon repair seems to be doing pretty good however the leg is so swollen and the foot is swollen is making the left knee hurt.  She is been wearing her cam walker.  Objective: Vital signs are stable she alert oriented x3 there is no erythema considerable edema pitting in nature dorsum of the foot and of the leg.  There is no pain on medial lateral compression of the calf.  She does have a history of venous insufficiency and destruction of the greater saphenous vein.  Assessment: Venous insufficiency status post Kidner procedure.  Plan: At this point I am going to send her for a venous duplex and ultrasound make sure that nothing is going on as far as clots.  Encouraged daily aspirin.  Encouraged compression wraps and Darco shoe.  On follow-up with her in the next 3 weeks.  Also wrote a prescription for Medrol Dosepak.  Recommended that she contact primary care provider to discuss a diuretic.

## 2018-07-18 ENCOUNTER — Ambulatory Visit (HOSPITAL_COMMUNITY)
Admission: RE | Admit: 2018-07-18 | Discharge: 2018-07-18 | Disposition: A | Discharge status: Home / Self Care | Payer: 59 | Source: Ambulatory Visit | Attending: Cardiovascular Disease | Admitting: Cardiovascular Disease

## 2018-07-18 ENCOUNTER — Encounter (HOSPITAL_COMMUNITY): Payer: Self-pay

## 2018-07-18 ENCOUNTER — Telehealth: Payer: Self-pay | Admitting: *Deleted

## 2018-07-18 DIAGNOSIS — I890 Lymphedema, not elsewhere classified: Secondary | ICD-10-CM

## 2018-07-18 DIAGNOSIS — I872 Venous insufficiency (chronic) (peripheral): Secondary | ICD-10-CM

## 2018-07-18 DIAGNOSIS — I82591 Chronic embolism and thrombosis of other specified deep vein of right lower extremity: Secondary | ICD-10-CM | POA: Diagnosis not present

## 2018-07-18 NOTE — Telephone Encounter (Signed)
Send her to the lymphedema clinic

## 2018-07-18 NOTE — Telephone Encounter (Signed)
Kimberly Hoover - [facility] states the venous studies are negative for DVT and Venous insufficiency.

## 2018-07-18 NOTE — Telephone Encounter (Signed)
Falecha - [facility] scheduled pt for 07/18/2018 arrive 2:45pm for 3:00pm testing. I informed pt of today's appt and pt states she will be there. [facility]

## 2018-07-18 NOTE — Telephone Encounter (Signed)
-----   Message from Kristian CoveyAshley E Prevette, [facility] sent at 07/17/2018  4:30 PM EST ----- Regarding: Vascular Venous duplex/ultrasound - Evaluate for DVT and venous insufficiency left foot S/p EPF/kidner procedure/peroneal tendon repair left  ASAP!!

## 2018-07-19 NOTE — Telephone Encounter (Signed)
I called pt and informed of Dr. Geryl RankinsHyatt's referral to [facility] [phone].

## 2018-07-19 NOTE — Telephone Encounter (Signed)
Faxed referral, clinical LOV and demographics to [facility].

## 2018-07-19 NOTE — Addendum Note (Signed)
Addended by: Alphia Kava'CONNELL, Frutoso Dimare D on: 07/19/2018 10:00 AM   Modules accepted: Orders

## 2018-07-20 ENCOUNTER — Encounter: Payer: Self-pay | Admitting: Podiatry

## 2018-07-23 NOTE — Telephone Encounter (Signed)
Faxed orders for 30-7940mmHg knee-length compression hose. I called [facility] states their earliest appt is 09/04/2018 and pt is on the Waiting list for possibly cancelled appts. Emailed Lymphedema Clinic appts were 09/04/2018 as earliest and she was on the cancellation waiting list.

## 2018-07-23 NOTE — Telephone Encounter (Signed)
Mailed rx for compression hose to pt.

## 2018-07-30 ENCOUNTER — Other Ambulatory Visit: Payer: Self-pay | Admitting: Family Medicine

## 2018-07-30 DIAGNOSIS — E2839 Other primary ovarian failure: Secondary | ICD-10-CM

## 2018-08-14 ENCOUNTER — Encounter: Payer: Self-pay | Admitting: Podiatry

## 2018-08-14 ENCOUNTER — Ambulatory Visit: Payer: 59 | Admitting: Podiatry

## 2018-08-14 DIAGNOSIS — M722 Plantar fascial fibromatosis: Secondary | ICD-10-CM

## 2018-08-14 DIAGNOSIS — Z9889 Other specified postprocedural states: Secondary | ICD-10-CM

## 2018-08-14 DIAGNOSIS — M66872 Spontaneous rupture of other tendons, left ankle and foot: Secondary | ICD-10-CM

## 2018-08-14 DIAGNOSIS — I890 Lymphedema, not elsewhere classified: Secondary | ICD-10-CM

## 2018-08-14 MED ORDER — METHYLPREDNISOLONE 4 MG PO TBPK: ORAL_TABLET | ORAL | 0 refills | Status: DC

## 2018-08-15 NOTE — Progress Notes (Signed)
She presents today for follow-up of her EPF and repair of her posterior tibial tendon with Kidner procedure left.  States that is still she still having pain and swelling but is really not at the surgical site she states that the surgical site feels better states that a lot of the lymphedema has come down with the anti-inflammatory but is still swell she continues to wear her compression hose and just really does not seem to be making much headway other than with the anti-inflammatory.  Date of surgery is May 04, 2018 she will be following up with the lymphedema clinic at [facility] 4 February.  Objective: Vital signs are stable she is alert and oriented x3.  Edema to the left lower extremity pitting in nature appears to be a lymphedema.  Venous evaluation demonstrates no venous insufficiency and no blood clots.  Exam today does demonstrate bilateral edema just worse on the left side.  She has minimal tenderness on palpation of the surgical sites.  Assessment: Well-healing surgical foot with secondary lymphedema left.  Plan: We will put her back on a low-dose steroid to see if we can get rid of this excess fluid and she will follow-up with the lymphedema clinic next month and I will follow-up with her after that.

## 2018-08-24 DIAGNOSIS — E559 Vitamin D deficiency, unspecified: Secondary | ICD-10-CM | POA: Insufficient documentation

## 2018-09-04 ENCOUNTER — Other Ambulatory Visit: Payer: Self-pay

## 2018-09-04 ENCOUNTER — Ambulatory Visit: Payer: 59 | Attending: Podiatry | Admitting: Occupational Therapy

## 2018-09-04 ENCOUNTER — Encounter: Payer: Self-pay | Admitting: Occupational Therapy

## 2018-09-04 DIAGNOSIS — I890 Lymphedema, not elsewhere classified: Secondary | ICD-10-CM | POA: Insufficient documentation

## 2018-09-04 NOTE — Patient Instructions (Signed)

## 2018-09-05 NOTE — Therapy (Signed)
[place] [facility] MAIN [facility] SERVICES [address] [place], [place], [address] Phone: [phone]   Fax:  [phone]  Occupational Therapy Evaluation  Patient Details  Name: Kimberly Hoover MRN: [id number] Date of Birth: [birth year] Referring Provider (OT): Ernestene Kiel, [place]   Encounter Date: 09/04/2018  OT End of Session - 09/05/18 1149    Visit Number  1    Number of Visits  36    Date for OT Re-Evaluation  12/03/18    OT Start Time  0300    OT Stop Time  0415    OT Time Calculation (min)  75 min    Activity Tolerance  Patient tolerated treatment well;No increased pain    Behavior During Therapy  WFL for tasks assessed/performed       Past Medical History:  Diagnosis Date  . Otitis media   . Peripheral edema   . Sinusitis     Past Surgical History:  Procedure Laterality Date  . CHOLECYSTECTOMY    . KNEE SURGERY    . SHOULDER SURGERY      There were no vitals filed for this visit.  Subjective Assessment - 09/04/18 0816    Pertinent History  pre-existing BLE swelling s/p episode of phlebitis in early 20's; Hx CVI; hx LE vein ablation (Laterality?); Vascular study 07/19/2018 ruled out DVT and VI;  "jumpy legs"; s/p EPF/ posterior tendon repair LLE 05/2018;     Limitations  difficulty walking, impaired transfers;     Repetition  Increases Symptoms    Special Tests  + Stemmer sign on L; - Stemmer on R    Patient Stated Goals  reduce swelling and pain and keep it from getting worse; improve ankle and foot AROM to limit fall risk    Currently in Pain?  Yes    Pain Score  4     Pain Location  Ankle   L ankle, foot and leg   Pain Orientation  Left    Pain Descriptors / Indicators  Guarding;Tender;Other (Comment);Numbness;Pressure;Sore;Tightness;Heaviness;Discomfort;Restless;Tiring;Grimacing   fullness   Pain Type  Chronic pain    Pain Onset  Other (comment)   05/2018   Pain Frequency  Constant    Aggravating Factors   standing, walking,  transfers,     Pain Relieving Factors  rubbing    Effect of Pain on Daily Activities  LLE swelling and associated pain limit basic and instrumental ADLs, work and productive activities, [employer], sociial participation, role performance and body image    Multiple Pain Sites  Yes    Pain Score  --   not rated numerically   Pain Location  Back    Pain Orientation  Lower    Pain Descriptors / Indicators  Aching;Sore;Discomfort    Pain Onset  --   chronic   Pain Frequency  Intermittent    Aggravating Factors   bending, twisting, lifting, carrying, transfers, bed mobility        [facility] OT Assessment - 09/05/18 0001      Assessment   Medical Diagnosis  mild, stage II, LLE LE 2/2 suspected venous and lympjhatic insufficiency and multiple trauma    Referring Provider (OT)  Ernestene Kiel, DPM    Onset Date/Surgical Date  --   onset BLE in 20's s/p phlebitis, 05/2018 s/p sx   Hand Dominance  Right    Prior Therapy  compression knee highs 30-40 mmHg; steroids, diuretic      Precautions   Precautions  Other (comment)  LE skin precautions-cellulitis risk     Restrictions   Weight Bearing Restrictions  No  (Pended)       Balance Screen   Has the patient fallen in the past 6 months  No    Has the patient had a decrease in activity level because of a fear of falling?   Yes    Is the patient reluctant to leave their home because of a fear of falling?   No      Prior Function   Level of Independence  Independent;Independent with basic ADLs;Independent with household mobility without device;Independent with community mobility without device;Independent with homemaking with wheelchair;Independent with gait;Independent with transfers    Vocation  Full time employment   school admin   Vocation Requirements  standing, walking, extended sitting, driving    Leisure  family      IADL   Prior Level of Function Shopping  i  (Pended)     Air cabin crewhopping  Shops independently for [employer]small purchases   (Pended)     Prior Level of Function Light Housekeeping  i  (Pended)     Light Housekeeping  Performs light daily tasks such as dishwashing, bed making;Does personal laundry completely;Performs light daily tasks but cannot maintain acceptable level of cleanliness  (Pended)     Prior Level of Function Meal Prep  i  (Pended)     Meal Prep  Able to complete simple warm meal prep;Able to complete simple cold meal and snack prep  (Pended)    must sit for prep   Prior Level of Function Community Mobility  i  (Pended)     Education officer, environmentalCommunity Mobility  Drives own vehicle  (Pended)       Mobility   Mobility Status  Independent  (Pended)     Mobility Status Comments  increased fall risk w/ limited foot and ankle ROM  (Pended)       Activity Tolerance   Activity Tolerance  Endurance does not limit participation in activity  (Pended)     Activity Tolerance Comments  limited tolerance for activities requiring standing, walking and/or dependent sitting 2/2 LLE pain and swelling  (Pended)       Cognition   Overall Cognitive Status  Within Functional Limits for tasks assessed  (Pended)       Sensation   Light Touch  Appears Intact  (Pended)     Proprioception  Appears Intact  (Pended)       Coordination   Gross Motor Movements are Fluid and Coordinated  Yes  (Pended)     Fine Motor Movements are Fluid and Coordinated  Yes  (Pended)       ROM / Strength   AROM / PROM / Strength  AROM;Strength  (Pended)       Palpation   Palpation comment  hypersensativity w/ L foot, ankle and calf palpation  (Pended)       AROM   Overall AROM   Deficits;Other (comment)  (Pended)    due to limited soft tissue flexibility and swelling   AROM Assessment Site  Ankle;Other (comment)  (Pended)    feet   Right/Left Ankle  Right;Left  (Pended)     Right Ankle Dorsiflexion  --  (Pended)    WNL   Right Ankle Plantar Flexion  --  (Pended)    WNL   Right Ankle Inversion  --  (Pended)    WNL   Right Ankle Eversion  --   (Pended)    WNL  Left Ankle Dorsiflexion  --  (Pended)    10/20   Left Ankle Plantar Flexion  --  (Pended)    20/50      LYMPHEDEMA/ONCOLOGY QUESTIONNAIRE - 09/05/18 1216      What other symptoms do you have   Are you Having Heaviness or Tightness  Yes    Are you having Pain  Yes    Are you having pitting edema  Yes    Is it Hard or Difficult finding clothes that fit  Yes    Do you have infections  No    Is there Decreased scar mobility  Yes    Stemmer Sign  Yes      Lymphedema Stage   Stage  STAGE 2 SPONTANEOUSLY IRREVERSIBLE      Lymphedema Assessments   Lymphedema Assessments  Lower extremities   BLE comparative limb volumetrics, LLIS       Mild, Stage II, LLE lymphedema  2/2 s orthopedic trauma and suspected venous insufficiency  Skin  Description Hyper-Keratosis Peau' de Orange Shiny Tight Fibrotic Fatty Doughy Indurated      L ankle and calf; R calf Mild-L ankle and foot, B calves   L ankle- mild   Hydration Dry Flaky Erythema Macerated   severe mild     Color Redness Present Pallor Blanching Hemosiderin Staining Other      trace     Odor Malodorous Yeast  Absent      x   Temperature Warm Cool wnl     x   Pitting Edema   1+ 2+ 3+ 4+ Non-pitting     L anterior leg       Girth Symmetrical Asymmetrical Other Distribution    L>R below knee    Stemmer Sign Positive Negative     L R   Lymphorrea History Of:  Present Absent   denies      Wounds History Of Present Absent Venous Arterial Pressure Size    denies            Signs of Infection Redness Warmth Erythema Acute Swelling Drainage Borders                   Scars Adhesions Hypersensitivity    LLE foot and ankle-mild    Sensation Light Touch Deep pressure Hypersensitivty   Present Impaired Present Impaired Absent Impaired   x      x  x  Nails WNL Fungus Other   x     Hair Growth Symmetrical Asymmetrical    x   Skin Creases Base of toes Ankle Base of Finger Medial Thigh          LLE                        OT Education - 09/04/18 1202    Education Details  Provided Pt and family skilled education regarding lymphatic structure and function, lymphedema etiologies, onset patterns and stages of progression. Discussed  impact of obesity on lymphatic system function. Outlined Complete Decongestive Therapy (CDT)  as standard of LE care and provided in depth information regarding 4 primary components of both Intensive and Self Management Phases, including Manual Lymph Drainage (MLD), compression wrapping and garments, skin care, and therapeutic exercise.   Discussed   Importance of daily, ongoing LE self-care essential to retaining clinical gains and limiting progression.  Lastly, reviewed lymphedema precautions, including cellulitis risk and difficulty with wound healing. Provided printed Lymphedema Workbook for reference.  Person(s) Educated  Patient    Methods  Explanation;Demonstration;Handout    Comprehension  Verbalized understanding;Returned demonstration          OT Long Term Goals - 09/04/18 1203      OT LONG TERM GOAL #1   Title  Pt will demonstrate understanding of lymphedema (LE) precautions / prevention principals, including signs / symptoms of cellulitis infection with modified independence  using LE Workbook to identify 6 LE precautions and all signs/ symptoms of cellulitis without verbal cues by end of 4th OT Rx visit.     Baseline  Max A    Time  4    Period  Days    Status  New    Target Date  --   4th OT visit     OT LONG TERM GOAL #2   Title  Pt will be able to apply knee length, multi-layer, short stretch compression wraps using correct gradient techniques independently  to achieve optimal limb volume reduction, and to return affected limb , as closely as possible, to premorbid size.    Baseline  Max A    Time  4    Period  Days    Status  New    Target Date  --   4th OT visit     OT LONG TERM GOAL #3   Title  Pt to achieve  10% limb volume reduction in LLE below the knee, and 5% reduction in RLE below the knee   during Intensive Phase CDT to improve functional ambulation and mobility,  to improve tissue flexibility and AROM at ankle and foot,  and reduce infection risk.    Baseline  Max A    Time  12    Period  Weeks    Status  New    Target Date  12/03/18      OT LONG TERM GOAL #4   Title  Pt will achieve and achieve and sustain at least 85% compliance with all daily LE self-care home program components throughout Intensive and Management Phase CDT, including impeccable skin care, simple self-MLD, gradient compression wrapping, and therapeutic exercise to ensure optimal limb volume reduction and to limit LE progression.    Baseline  Max A    Time  12    Period  Weeks    Status  New    Target Date  12/03/18      OT LONG TERM GOAL #5   Title  Pt will be able to  don and doff compression garments/ HOS devices with modified independence using assistive devices PRN for optimal long-term management of BLE LE    Baseline  ax A    Time  12    Period  Weeks    Status  New    Target Date  12/03/18   issue date     OT LONG TERM GOAL #6   Title  During self-management phase of CDT Pt will retain limb volume reductions achieved during Intensive Phase CDT with no more than 2% volume increase with ongoing CG assistance to limit LE progression and further functional decline.    Baseline  Max A    Time  6    Period  Months    Status  New    Target Date  03/04/19            Plan - 09/05/18 1206    Clinical Impression Statement  Kimberly JollyLoretta Hoover is a 58y o female presenting with mild, stage II, BLE  lymphedema 2/2 orthopedic trauma and associated chronic inflammation and suspected venous insufficiency. Leg pain and associated swelling limits ambulation and functional mobility, basic and instrumental ADLs performance (fitting LB clothing and shoes, shopping, home management, cooking), participation in leisure pursuits  (family and gardening) productive / work activities and limits social participation. Kimberly Hoover will benefit from Occupational Therapy for Intensive and Management Phase Complete Decongestive Therapy (CDT), to include manual lymphatic drainage, skin care, therapeutic exercise and compression therapy, to reduce and normalize limb swelling, to reduce infection risk and improve functional performance in all occupational domains. Emphasis throughout OT course will focus on Pt education for long term LE self-care. Pt will be fit with appropriate compression garments and devices to control and contain limb swelling and to limit progression.  Without skilled OT for CDT LE will progress resulting in worsening condition, ongoing infection risk and further functional decline.    Occupational performance deficits (Please refer to evaluation for details):  ADL's;Work;IADL's;Rest and Sleep;Leisure;Social Participation;Other   role performance, body image   Rehab Potential  Good    OT Frequency  3x / week    OT Duration  12 weeks    OT Treatment/Interventions  Self-care/ADL training;Therapeutic exercise;Gait Training;Therapeutic activities;DME and/or AE instruction;Manual Therapy;Compression bandaging;Patient/family education;Coping strategies training    Clinical Decision Making  Several treatment options, min-mod task modification necessary    OT Home Exercise Plan  BLE lymphatic pumping ther ex- 2 sets of 10 each in sequence    Recommended Other Services  fit w/ appropriate daytime compression garments- consider flat knit Elvarex classic for skin micromassage feature. Consider Jobst RELAX for HOS to soften scar and fibrosis, to  improve tissue flexibility and to limit fibrosis formation during HOS    Consulted and Agree with Plan of Care  Patient       Patient will benefit from skilled therapeutic intervention in order to improve the following deficits and impairments:  Abnormal gait, Decreased activity tolerance,  Decreased knowledge of use of DME, Decreased range of motion, Decreased skin integrity, Decreased strength, Increased edema, Impaired flexibility, Pain, Decreased balance, Decreased knowledge of precautions, Decreased mobility  Visit Diagnosis: Lymphedema, not elsewhere classified - Plan: Ot plan of care cert/re-cert    Problem List Patient Active Problem List   Diagnosis Date Noted  . Closed nondisplaced fracture of proximal phalanx of lesser toe of right foot 01/27/2017    Loel Dubonnet, MS, OTR/L, [facility] 09/05/18 12:29 PM   [facility] MAIN [facility] SERVICES [address] [place], [place], [address] Phone: [phone]   Fax:  [phone]  Name: Kimberly Hoover MRN: [id number] Date of Birth: 1961-04-10

## 2018-09-10 ENCOUNTER — Ambulatory Visit: Payer: 59 | Admitting: Occupational Therapy

## 2018-09-10 DIAGNOSIS — I890 Lymphedema, not elsewhere classified: Secondary | ICD-10-CM

## 2018-09-10 NOTE — Therapy (Signed)
[facility] [address] , [place], [address] Phone: [phone]   Fax:  [phone]  Occupational Therapy Treatment  Patient Details  Name: Kimberly JollyLoretta Hoover MRN: [id number] Date of Birth: Mar 31, 1961 Referring Provider (OT): Ernestene KielMax Hyatt, [place]   Encounter Date: 09/10/2018  OT End of Session - 09/10/18 1618    OT Start Time  0100    OT Stop Time  0215    OT Time Calculation (min)  75 min    Activity Tolerance  Patient tolerated treatment well;No increased pain    Behavior During Therapy  WFL for tasks assessed/performed       Past Medical History:  Diagnosis Date  . Otitis media   . Peripheral edema   . Sinusitis     Past Surgical History:  Procedure Laterality Date  . CHOLECYSTECTOMY    . KNEE SURGERY    . SHOULDER SURGERY      There were no vitals filed for this visit.  Subjective Assessment - 09/10/18 1312    Subjective   Kimberly PulleyLori Hoover presents for OT treatment visit 2/ 34 to address LLE lymphedema. Pt is rready to commence CDT/  (Pended)     Pertinent History  pre-existing BLE swelling s/p episode of phlebitis in early 20's; Hx CVI; hx LE vein ablation (Laterality?); Vascular study 07/19/2018 ruled out DVT and VI;  "jumpy legs"; s/p EPF/ posterior tendon repair LLE 05/2018;   (Pended)     Limitations  difficulty walking, impaired transfers;   (Pended)     Repetition  Increases Symptoms  (Pended)     Special Tests  + Stemmer sign on L; - Stemmer on R  (Pended)     Patient Stated Goals  reduce swelling and pain and keep it from getting worse; improve ankle and foot AROM to limit fall risk  (Pended)     Currently in Pain?  Yes  (Pended)     Pain Score  4   (Pended)     Pain Location  Leg  (Pended)     Pain Orientation  Left  (Pended)     Pain Descriptors / Indicators  Nagging;Aching;Shooting;Tender;Numbness;Sore;Heaviness;Discomfort;Tightness;Tingling;Tiring;Other (Comment)  (Pended)    stiffness   Pain Type   Chronic pain  (Pended)     Pain Onset  Other (comment)  (Pended)    05/2018   Pain Frequency  Intermittent  (Pended)     Aggravating Factors   standing, walking, transferring  (Pended)     Pain Onset  --  (Pended)    chronic         LYMPHEDEMA/ONCOLOGY QUESTIONNAIRE - 09/10/18 1634      Right Lower Extremity Lymphedema   Other  RLE limb volume drom ankle to below knee (A-D) measures 4368.87 ml.      Left Lower Extremity Lymphedema   Other  LLE ( dominant, Rx) lA-D limb volume measures 4098.15 ml.    Other  Limb volume differential (LVD) for A-D segments measures 6.61%, R>L. Measurements do not capture L ankle and foot swelling well, and do not illuminate apparent muscle wasting of LLE below the knee.. Dominant RLE may also be increased in size  2/2 compensatory use s/p LLE surgery. and ongoing pain and associated swelling.              OT Treatments/Exercises (OP) - 09/10/18 0001      ADLs   ADL Education Given  Yes      Manual Therapy   Manual  Therapy  Edema management;Compression Bandaging    Edema Management  initial BLE comparative limb volumetrics    Compression Bandaging  Multilayer gradient compression wrap to LLE- knee length- using 3 layers short stretch wraps in 8, 10, and 12 cm width x 5 m applied over initial cotton stockinett layer , then base layer of .04 mm thick Rosidal foam. Pt instructed on wrap wear regime and laundry instruction.             OT Education - 09/10/18 1617    Education Details  Pt edu for LE assessments including LLIS and outcomes for initial BLE comparative limb volumetrics. Commenced entry level edu for individualized gradient compression wrapping. Handout provided.    Person(s) Educated  Patient    Methods  Explanation;Demonstration;Handout;Verbal cues    Comprehension  Verbalized understanding;Returned demonstration;Verbal cues required          OT Long Term Goals - 09/04/18 1203      OT LONG TERM GOAL #1   Title  Pt  will demonstrate understanding of lymphedema (LE) precautions / prevention principals, including signs / symptoms of cellulitis infection with modified independence  using LE Workbook to identify 6 LE precautions and all signs/ symptoms of cellulitis without verbal cues by end of 4th OT Rx visit.     Baseline  Max A    Time  4    Period  Days    Status  New    Target Date  --   4th OT visit     OT LONG TERM GOAL #2   Title  Pt will be able to apply knee length, multi-layer, short stretch compression wraps using correct gradient techniques independently  to achieve optimal limb volume reduction, and to return affected limb , as closely as possible, to premorbid size.    Baseline  Max A    Time  4    Period  Days    Status  New    Target Date  --   4th OT visit     OT LONG TERM GOAL #3   Title  Pt to achieve 10% limb volume reduction in LLE below the knee, and 5% reduction in RLE below the knee   during Intensive Phase CDT to improve functional ambulation and mobility,  to improve tissue flexibility and AROM at ankle and foot,  and reduce infection risk.    Baseline  Max A    Time  12    Period  Weeks    Status  New    Target Date  12/03/18      OT LONG TERM GOAL #4   Title  Pt will achieve and achieve and sustain at least 85% compliance with all daily LE self-care home program components throughout Intensive and Management Phase CDT, including impeccable skin care, simple self-MLD, gradient compression wrapping, and therapeutic exercise to ensure optimal limb volume reduction and to limit LE progression.    Baseline  Max A    Time  12    Period  Weeks    Status  New    Target Date  12/03/18      OT LONG TERM GOAL #5   Title  Pt will be able to  don and doff compression garments/ HOS devices with modified independence using assistive devices PRN for optimal long-term management of BLE LE    Baseline  ax A    Time  12    Period  Weeks    Status  New  Target Date  12/03/18    issue date     OT LONG TERM GOAL #6   Title  During self-management phase of CDT Pt will retain limb volume reductions achieved during Intensive Phase CDT with no more than 2% volume increase with ongoing CG assistance to limit LE progression and further functional decline.    Baseline  Max A    Time  6    Period  Months    Status  New    Target Date  03/04/19            Plan - 09/10/18 1623    Clinical Impression Statement  Initial BLE comparative limb volumetrics reveals non-dominant, nonRx RLE is actually latger in volume below the knee by 6.61% than LLE Rx limb. Despite palpable, increased, protien rich  swelling at LLE foot ankle and distal leg, volume measurements for inverted cone do not capture this distribution well. Limb volume in general below the knee appears decreased compared with RLE, and L leg feels soft and pliable compaered with more muscular RLE calf. I suspect this is due to muscle wasting from discuse and RLE cmpensation since   surgery. Pt reports she feels somewhat off balance sometimes. Manual muscle testing at knee, ankle and foot reveal mild strength  deficits in LLE compared to RLE.  Pt was engaged in all aspects of LE self care home program instruction today, and she tolerated initial application  of knee length gradient compr4ession wrap to LLE . Cont as per POC.    Occupational performance deficits (Please refer to evaluation for details):  ADL's;Work;IADL's;Rest and Sleep;Leisure;Social Participation;Other   role performance, body image   Rehab Potential  Good    OT Frequency  3x / week    OT Duration  12 weeks    OT Treatment/Interventions  Self-care/ADL training;Therapeutic exercise;Gait Training;Therapeutic activities;DME and/or AE instruction;Manual Therapy;Compression bandaging;Patient/family education;Coping strategies training    Clinical Decision Making  Several treatment options, min-mod task modification necessary    OT Home Exercise Plan  BLE  lymphatic pumping ther ex- 2 sets of 10 each in sequence    Recommended Other Services  fit w/ appropriate daytime compression garments- consider flat knit Elvarex classic for skin micromassage feature. Consider Jobst RELAX for HOS to soften scar and fibrosis, to  improve tissue flexibility and to limit fibrosis formation during HOS    Consulted and Agree with Plan of Care  Patient       Patient will benefit from skilled therapeutic intervention in order to improve the following deficits and impairments:  Abnormal gait, Decreased activity tolerance, Decreased knowledge of use of DME, Decreased range of motion, Decreased skin integrity, Decreased strength, Increased edema, Impaired flexibility, Pain, Decreased balance, Decreased knowledge of precautions, Decreased mobility  Visit Diagnosis: Lymphedema, not elsewhere classified    Problem List Patient Active Problem List   Diagnosis Date Noted  . Closed nondisplaced fracture of proximal phalanx of lesser toe of right foot 01/27/2017    Loel Dubonnetheresa Josephanthony Tindel, MS, OTR/L, [facility] 09/10/18 4:39 PM  [facility] [address] [place], [place], [address] Phone: [phone]   Fax:  [phone]  Name: Kimberly JollyLoretta Hoover MRN: [id number] Date of Birth: [birth year]

## 2018-09-11 ENCOUNTER — Ambulatory Visit: Payer: 59 | Admitting: Podiatry

## 2018-09-11 ENCOUNTER — Encounter: Payer: Self-pay | Admitting: Podiatry

## 2018-09-11 DIAGNOSIS — I890 Lymphedema, not elsewhere classified: Secondary | ICD-10-CM

## 2018-09-12 NOTE — Progress Notes (Signed)
She presents today for follow-up of her lymphedema to the left lower extremity.  She states is making my foot feel better being treated with the lymphedema clinic.  She states that that she thinks that the clinic is about to get her fixed up.  Objective: Physical exam is limited just to the toes today because she had a compression dressing to the left foot.  Already the left foot looks smaller than it did previously.  Assessment: Well-healing surgical foot with lymphedema left lymphedema currently being controlled at [facility].  Plan: Follow-up with me once the lymphedema clinic is complete.

## 2018-09-13 ENCOUNTER — Ambulatory Visit: Payer: 59 | Admitting: Occupational Therapy

## 2018-09-13 DIAGNOSIS — I890 Lymphedema, not elsewhere classified: Secondary | ICD-10-CM

## 2018-09-13 NOTE — Therapy (Signed)
[facility] MAIN [facility] SERVICES [address] [place], [place], [address] Phone: [phone]   Fax:  [phone]  Occupational Therapy Treatment  Patient Details  Name: Kimberly Hoover MRN: [id number] Date of Birth: [birth year] Referring Provider (OT): Ernestene Kiel, [place]   Encounter Date: 09/13/2018  OT End of Session - 09/13/18 1535    Visit Number  4    Number of Visits  36    OT Start Time  0100    OT Stop Time  0205    OT Time Calculation (min)  65 min    Activity Tolerance  Patient tolerated treatment well;No increased pain    Behavior During Therapy  WFL for tasks assessed/performed       Past Medical History:  Diagnosis Date  . Otitis media   . Peripheral edema   . Sinusitis     Past Surgical History:  Procedure Laterality Date  . CHOLECYSTECTOMY    . KNEE SURGERY    . SHOULDER SURGERY      There were no vitals filed for this visit.  Subjective Assessment - 09/13/18 1525    Subjective   Kimberly Hoover presents for OT treatment visit 3/ 34 to address LLE lymphedema. Pt presents with multilayer compression wrwaps in place.    Pertinent History  pre-existing BLE swelling s/p episode of phlebitis in early 20's; Hx CVI; hx LE vein ablation (Laterality?); Vascular study 07/19/2018 ruled out DVT and VI;  "jumpy legs"; s/p EPF/ posterior tendon repair LLE 05/2018;     Limitations  difficulty walking, impaired transfers;     Repetition  Increases Symptoms    Special Tests  + Stemmer sign on L; - Stemmer on R    Patient Stated Goals  reduce swelling and pain and keep it from getting worse; improve ankle and foot AROM to limit fall risk    Currently in Pain?  Yes    Pain Score  --   unchanged from initial evaluation   Pain Onset  Other (comment)   05/2018   Pain Onset  --   chronic                  OT Treatments/Exercises (OP) - 09/13/18 0001      ADLs   ADL Education Given  Yes      Manual Therapy   Manual Therapy   Edema management;Manual Lymphatic Drainage (MLD);Compression Bandaging    Manual Lymphatic Drainage (MLD)  MLE to LLE utilizing short neck sequence, deep abdominal pathways and functional ingunall watershed. Used gentle fibrosis techniques to medial and lateral malleoli. good tolerance    Compression Bandaging  Multilayer gradient compression wrap to LLE- knee length- -added lateral malleolar pad to inferior, lateral malleolus fabricated from orange foam to increase compression to fibrotic area. Applied 3 layers short stretch wraps in 8, 10, and 12 cm width x 5 m applied over initial cotton stockinett layer , then base layer of .04 mm thick Rosidal foam. Pt instructed on wrap wear regime and laundry instruction.             OT Education - 09/13/18 1531    Education Details  Cont Pt edu for multilayer gradient compression wrapping. Commenced Pt edu for simple self-MLD for "short neck sequence" and deep abdominal breathing. Good return w/ verbal cues and demonstration    Person(s) Educated  Patient    Methods  Explanation;Demonstration;Handout;Verbal cues    Comprehension  Verbalized understanding;Returned demonstration;Verbal cues required  OT Long Term Goals - 09/04/18 1203      OT LONG TERM GOAL #1   Title  Pt will demonstrate understanding of lymphedema (LE) precautions / prevention principals, including signs / symptoms of cellulitis infection with modified independence  using LE Workbook to identify 6 LE precautions and all signs/ symptoms of cellulitis without verbal cues by end of 4th OT Rx visit.     Baseline  Max A    Time  4    Period  Days    Status  New    Target Date  --   4th OT visit     OT LONG TERM GOAL #2   Title  Pt will be able to apply knee length, multi-layer, short stretch compression wraps using correct gradient techniques independently  to achieve optimal limb volume reduction, and to return affected limb , as closely as possible, to premorbid size.     Baseline  Max A    Time  4    Period  Days    Status  New    Target Date  --   4th OT visit     OT LONG TERM GOAL #3   Title  Pt to achieve 10% limb volume reduction in LLE below the knee, and 5% reduction in RLE below the knee   during Intensive Phase CDT to improve functional ambulation and mobility,  to improve tissue flexibility and AROM at ankle and foot,  and reduce infection risk.    Baseline  Max A    Time  12    Period  Weeks    Status  New    Target Date  12/03/18      OT LONG TERM GOAL #4   Title  Pt will achieve and achieve and sustain at least 85% compliance with all daily LE self-care home program components throughout Intensive and Management Phase CDT, including impeccable skin care, simple self-MLD, gradient compression wrapping, and therapeutic exercise to ensure optimal limb volume reduction and to limit LE progression.    Baseline  Max A    Time  12    Period  Weeks    Status  New    Target Date  12/03/18      OT LONG TERM GOAL #5   Title  Pt will be able to  don and doff compression garments/ HOS devices with modified independence using assistive devices PRN for optimal long-term management of BLE LE    Baseline  ax A    Time  12    Period  Weeks    Status  New    Target Date  12/03/18   issue date     OT LONG TERM GOAL #6   Title  During self-management phase of CDT Pt will retain limb volume reductions achieved during Intensive Phase CDT with no more than 2% volume increase with ongoing CG assistance to limit LE progression and further functional decline.    Baseline  Max A    Time  6    Period  Months    Status  New    Target Date  03/04/19              Patient will benefit from skilled therapeutic intervention in order to improve the following deficits and impairments:     Visit Diagnosis: Lymphedema, not elsewhere classified    Problem List Patient Active Problem List   Diagnosis Date Noted  . Closed nondisplaced fracture of  proximal phalanx  of lesser toe of right foot 01/27/2017    Loel Dubonnet, MS, OTR/L, [facility] 09/13/18 3:36 PM  [facility] MAIN [facility] SERVICES [address] [place], [place], [address] Phone: [phone]   Fax:  [phone]  Name: Kimberly Hoover MRN: [id number] Date of Birth: 05-16-1961

## 2018-09-25 ENCOUNTER — Ambulatory Visit: Payer: 59 | Admitting: Occupational Therapy

## 2018-09-25 DIAGNOSIS — I890 Lymphedema, not elsewhere classified: Secondary | ICD-10-CM | POA: Diagnosis not present

## 2018-09-25 NOTE — Therapy (Signed)
[place] MAIN [facility] SERVICES [address] [place], [place], [address] Phone: [phone]   Fax:  [phone]  Occupational Therapy Treatment  Patient Details  Name: Kimberly Hoover MRN: [id number] Date of Birth: Sep 15, 1960 Referring Provider (OT): Tyson Dense, [place]   Encounter Date: 09/25/2018  OT End of Session - 09/25/18 1715    Visit Number  5    Number of Visits  36    OT Start Time  0310    OT Stop Time  0426    OT Time Calculation (min)  76 min    Activity Tolerance  Patient tolerated treatment well;No increased pain    Behavior During Therapy  WFL for tasks assessed/performed       Past Medical History:  Diagnosis Date  . Otitis media   . Peripheral edema   . Sinusitis     Past Surgical History:  Procedure Laterality Date  . CHOLECYSTECTOMY    . KNEE SURGERY    . SHOULDER SURGERY      There were no vitals filed for this visit.  Subjective Assessment - 09/25/18 1710    Subjective   Kimberly Hoover presents for OT treatment visit 4/ 36 to address LLE lymphedema with compression wraps in place. Pt reports she wore bandages all weekend as directed while in [place] for a wedding . Pt reports custom malleolus pad at lateral L ankle "seems to be helping", and she requests pad for medial  malleolus.    Pertinent History  pre-existing BLE swelling s/p episode of phlebitis in early 20's; Hx CVI; hx LE vein ablation (Laterality?); Vascular study 07/19/2018 ruled out DVT and VI;  "jumpy legs"; s/p EPF/ posterior tendon repair LLE 05/2018;     Limitations  difficulty walking, impaired transfers;     Repetition  Increases Symptoms    Special Tests  + Stemmer sign on L; - Stemmer on R    Patient Stated Goals  reduce swelling and pain and keep it from getting worse; improve ankle and foot AROM to limit fall risk    Pain Score  6     Pain Location  Ankle    Pain Orientation  Left    Pain Descriptors / Indicators   Aching;Tender;Sore;Discomfort;Heaviness;Tightness;Tiring;Other (Comment)   fullness   Pain Type  Chronic pain    Pain Onset  Other (comment)   05/2018   Pain Frequency  Intermittent    Aggravating Factors   weight bearing, extended gravity dependent positioning, standing, walking, air travel    Pain Relieving Factors  elevation, compression, MLD    Pain Onset  --   chronic                  OT Treatments/Exercises (OP) - 09/25/18 0001      ADLs   ADL Education Given  Yes      Manual Therapy   Manual Therapy  Edema management;Manual Lymphatic Drainage (MLD);Compression Bandaging    Manual Lymphatic Drainage (MLD)  MLD to LLE utilizing short neck sequence, deep abdominal pathways and functional ingunall watershed. Used gentle fibrosis techniques to medial and lateral malleoli. good tolerance    Compression Bandaging  Added custom fabricated , teardrop shaped medial malleolus pad to L ankle today over stockinett and under wraps. Applied multilatyer compression wraps  toes to knee as established.             OT Education - 09/25/18 1715    Education Details  Cont Pt edu for multilayer  gradient compression wrapping. Commenced Pt edu for simple self-MLD for "short neck sequence" and deep abdominal breathing. Good return w/ verbal cues and demonstration    Person(s) Educated  Patient    Methods  Explanation;Demonstration;Handout;Verbal cues    Comprehension  Verbalized understanding;Returned demonstration;Verbal cues required          OT Long Term Goals - 09/25/18 1716      OT LONG TERM GOAL #1   Title  Pt will demonstrate understanding of lymphedema (LE) precautions / prevention principals, including signs / symptoms of cellulitis infection with modified independence  using LE Workbook to identify 6 LE precautions and all signs/ symptoms of cellulitis without verbal cues by end of 4th OT Rx visit.     Baseline  Max A    Time  4    Period  Days    Status  New       OT LONG TERM GOAL #2   Title  Pt will be able to apply knee length, multi-layer, short stretch compression wraps using correct gradient techniques independently  to achieve optimal limb volume reduction, and to return affected limb , as closely as possible, to premorbid size.    Baseline  Max A Met 09/25/18    Time  4    Period  Days    Status  New      OT LONG TERM GOAL #3   Title  Pt to achieve 10% limb volume reduction in LLE below the knee, and 5% reduction in RLE below the knee   during Intensive Phase CDT to improve functional ambulation and mobility,  to improve tissue flexibility and AROM at ankle and foot,  and reduce infection risk.    Baseline  Max A    Time  12    Period  Weeks    Status  New      OT LONG TERM GOAL #4   Title  Pt will achieve and achieve and sustain at least 85% compliance with all daily LE self-care home program components throughout Intensive and Management Phase CDT, including impeccable skin care, simple self-MLD, gradient compression wrapping, and therapeutic exercise to ensure optimal limb volume reduction and to limit LE progression.    Baseline  Max A    Time  12    Period  Weeks    Status  New      OT LONG TERM GOAL #5   Title  Pt will be able to  don and doff compression garments/ HOS devices with modified independence using assistive devices PRN for optimal long-term management of BLE LE    Baseline  ax A    Time  12    Period  Weeks    Status  New      OT LONG TERM GOAL #6   Title  During self-management phase of CDT Pt will retain limb volume reductions achieved during Intensive Phase CDT with no more than 2% volume increase with ongoing CG assistance to limit LE progression and further functional decline.    Baseline  Max A    Time  6    Period  Months    Status  New            Plan - 09/25/18 1718    Clinical Impression Statement  Pt met goal for independently applying gradient compression wraps to LLE today. Despite air travel  and being on her feet at a wedding over the weekendd she reports 100% compliance with compression over  the weekend. LLE swelling and palpable density at distal leg and foot/ ankle are visibly and palpably decreased today. Pt reports persistant ankle pain and stiffness. at rest and with weight bearing. Pt educated about sequential pneumatic devices during session to inform her re devices to assist w/ long term self management. She verbalized understanding that a trial is premature at this stage as her leg and ankle continues to steadily responsd to manual therapy . We'll arrange a trial PRN  when clinical gains plateau. PRN. Cont as per POC.    Occupational performance deficits (Please refer to evaluation for details):  ADL's;Work;IADL's;Rest and Sleep;Leisure;Social Participation;Other   role performance, body image   Rehab Potential  Good    OT Frequency  3x / week    OT Duration  12 weeks    OT Treatment/Interventions  Self-care/ADL training;Therapeutic exercise;Gait Training;Therapeutic activities;DME and/or AE instruction;Manual Therapy;Compression bandaging;Patient/family education;Coping strategies training    Clinical Decision Making  Several treatment options, min-mod task modification necessary    OT Home Exercise Plan  BLE lymphatic pumping ther ex- 2 sets of 10 each in sequence    Recommended Other Services  fit w/ appropriate daytime compression garments- consider flat knit Elvarex classic for skin micromassage feature. Consider Jobst RELAX for HOS to soften scar and fibrosis, to  improve tissue flexibility and to limit fibrosis formation during HOS    Consulted and Agree with Plan of Care  Patient       Patient will benefit from skilled therapeutic intervention in order to improve the following deficits and impairments:  Abnormal gait, Decreased activity tolerance, Decreased knowledge of use of DME, Decreased range of motion, Decreased skin integrity, Decreased strength, Increased edema,  Impaired flexibility, Pain, Decreased balance, Decreased knowledge of precautions, Decreased mobility  Visit Diagnosis: Lymphedema, not elsewhere classified    Problem List Patient Active Problem List   Diagnosis Date Noted  . Closed nondisplaced fracture of proximal phalanx of lesser toe of right foot 01/27/2017    Andrey Spearman, MS, OTR/L, [facility] 09/25/18 5:23 PM'  [place] MAIN [facility] SERVICES [address] [place], [place], [address] Phone: [phone]   Fax:  [phone]  Name: Kimberly Hoover MRN: [id number] Date of Birth: [birth year]

## 2018-09-27 ENCOUNTER — Ambulatory Visit: Payer: 59 | Admitting: Occupational Therapy

## 2018-09-27 DIAGNOSIS — I890 Lymphedema, not elsewhere classified: Secondary | ICD-10-CM

## 2018-09-27 NOTE — Therapy (Signed)
[place] MAIN [facility] SERVICES [address] [place], [place], [address] Phone: [phone]   Fax:  [phone]  Occupational Therapy Treatment  Patient Details  Name: Kimberly Hoover MRN: [id number] Date of Birth: [birth year] Referring Provider (OT): Tyson Dense, [place]   Encounter Date: 09/27/2018  OT End of Session - 09/27/18 1753    Visit Number  6    Number of Visits  41    OT Start Time  0235    OT Stop Time  0335    OT Time Calculation (min)  60 min    Activity Tolerance  Patient tolerated treatment well;No increased pain    Behavior During Therapy  WFL for tasks assessed/performed       Past Medical History:  Diagnosis Date  . Otitis media   . Peripheral edema   . Sinusitis     Past Surgical History:  Procedure Laterality Date  . CHOLECYSTECTOMY    . KNEE SURGERY    . SHOULDER SURGERY      There were no vitals filed for this visit.  Subjective Assessment - 09/27/18 1750    Subjective   Kimberly Hoover presents for OT treatment visit 5/ 36 to address LLE lymphedema with compression wraps in place. Pt reports "my ankle is a little sore today."    Pertinent History  pre-existing BLE swelling s/p episode of phlebitis in early 20's; Hx CVI; hx LE vein ablation (Laterality?); Vascular study 07/19/2018 ruled out DVT and VI;  "jumpy legs"; s/p EPF/ posterior tendon repair LLE 05/2018;     Limitations  difficulty walking, impaired transfers;     Repetition  Increases Symptoms    Special Tests  + Stemmer sign on L; - Stemmer on R    Patient Stated Goals  reduce swelling and pain and keep it from getting worse; improve ankle and foot AROM to limit fall risk    Currently in Pain?  Yes    Pain Score  --   not rated numerically   Pain Location  Ankle    Pain Orientation  Left    Pain Onset  Other (comment)   05/2018   Pain Onset  --   chronic                  OT Treatments/Exercises (OP) - 09/27/18 0001      ADLs   ADL  Education Given  Yes      Manual Therapy   Manual Therapy  Edema management;Manual Lymphatic Drainage (MLD);Compression Bandaging    Manual Lymphatic Drainage (MLD)  MLD to LLE utilizing short neck sequence, deep abdominal pathways and functional ingunall watershed. Used gentle fibrosis techniques to medial and lateral malleoli. good tolerance    Compression Bandaging  Applied thigh length gradient compression wraps today in effort to reduce swelling in knee and thigh. SDuplicated below the knee method and added 12 cm x t wraps with same layering technique to extend gradient to inguinal LN             OT Education - 09/27/18 1752    Education Details  Cont Pt edu for multilayer gradient compression wrapping. Commenced Pt edu for simple self-MLD for "short neck sequence" and deep abdominal breathing. Good return w/ verbal cues and demonstration    Person(s) Educated  Patient    Methods  Explanation;Demonstration;Handout;Verbal cues    Comprehension  Verbalized understanding;Returned demonstration;Verbal cues required          OT Long Term Goals -  09/25/18 1716      OT LONG TERM GOAL #1   Title  Pt will demonstrate understanding of lymphedema (LE) precautions / prevention principals, including signs / symptoms of cellulitis infection with modified independence  using LE Workbook to identify 6 LE precautions and all signs/ symptoms of cellulitis without verbal cues by end of 4th OT Rx visit.     Baseline  Max A    Time  4    Period  Days    Status  New      OT LONG TERM GOAL #2   Title  Pt will be able to apply knee length, multi-layer, short stretch compression wraps using correct gradient techniques independently  to achieve optimal limb volume reduction, and to return affected limb , as closely as possible, to premorbid size.    Baseline  Max A Met 09/25/18    Time  4    Period  Days    Status  New      OT LONG TERM GOAL #3   Title  Pt to achieve 10% limb volume reduction in  LLE below the knee, and 5% reduction in RLE below the knee   during Intensive Phase CDT to improve functional ambulation and mobility,  to improve tissue flexibility and AROM at ankle and foot,  and reduce infection risk.    Baseline  Max A    Time  12    Period  Weeks    Status  New      OT LONG TERM GOAL #4   Title  Pt will achieve and achieve and sustain at least 85% compliance with all daily LE self-care home program components throughout Intensive and Management Phase CDT, including impeccable skin care, simple self-MLD, gradient compression wrapping, and therapeutic exercise to ensure optimal limb volume reduction and to limit LE progression.    Baseline  Max A    Time  12    Period  Weeks    Status  New      OT LONG TERM GOAL #5   Title  Pt will be able to  don and doff compression garments/ HOS devices with modified independence using assistive devices PRN for optimal long-term management of BLE LE    Baseline  ax A    Time  12    Period  Weeks    Status  New      OT LONG TERM GOAL #6   Title  During self-management phase of CDT Pt will retain limb volume reductions achieved during Intensive Phase CDT with no more than 2% volume increase with ongoing CG assistance to limit LE progression and further functional decline.    Baseline  Max A    Time  6    Period  Months    Status  New            Plan - 09/27/18 1753    Clinical Impression Statement  LLE limb volume below the knee appears significantly decreased and pitting has resolved. Fibrosis has softened at malleoli , but persists after initial week of OT for CDT.  Pt tolerating all aspects of therapy. We 4xtended compression wraps above knee and groin today in effort to reduce proximal swelling. Cont as per POC.    Occupational performance deficits (Please refer to evaluation for details):  ADL's;Work;IADL's;Rest and Sleep;Leisure;Social Participation;Other   role performance, body image   Rehab Potential  Good    OT  Frequency  3x / week  OT Duration  12 weeks    OT Treatment/Interventions  Self-care/ADL training;Therapeutic exercise;Gait Training;Therapeutic activities;DME and/or AE instruction;Manual Therapy;Compression bandaging;Patient/family education;Coping strategies training    Clinical Decision Making  Several treatment options, min-mod task modification necessary    OT Home Exercise Plan  BLE lymphatic pumping ther ex- 2 sets of 10 each in sequence    Recommended Other Services  fit w/ appropriate daytime compression garments- consider flat knit Elvarex classic for skin micromassage feature. Consider Jobst RELAX for HOS to soften scar and fibrosis, to  improve tissue flexibility and to limit fibrosis formation during HOS    Consulted and Agree with Plan of Care  Patient       Patient will benefit from skilled therapeutic intervention in order to improve the following deficits and impairments:  Abnormal gait, Decreased activity tolerance, Decreased knowledge of use of DME, Decreased range of motion, Decreased skin integrity, Decreased strength, Increased edema, Impaired flexibility, Pain, Decreased balance, Decreased knowledge of precautions, Decreased mobility  Visit Diagnosis: Lymphedema, not elsewhere classified    Problem List Patient Active Problem List   Diagnosis Date Noted  . Closed nondisplaced fracture of proximal phalanx of lesser toe of right foot 01/27/2017    Andrey Spearman, MS, OTR/L, [facility] 09/27/18 5:57 PM'  [place] MAIN [facility] SERVICES [address] [place], [place], [address] Phone: [phone]   Fax:  [phone]  Name: Kimberly Hoover MRN: [id number] Date of Birth: 11/28/1960

## 2018-10-01 ENCOUNTER — Ambulatory Visit: Payer: 59 | Admitting: Occupational Therapy

## 2018-10-04 ENCOUNTER — Ambulatory Visit: Payer: 59 | Attending: Podiatry | Admitting: Occupational Therapy

## 2018-10-04 DIAGNOSIS — I890 Lymphedema, not elsewhere classified: Secondary | ICD-10-CM | POA: Insufficient documentation

## 2018-10-04 NOTE — Therapy (Signed)
[place] MAIN [facility] SERVICES [address] [place], [place], [address] Phone: [phone]   Fax:  [phone]  Occupational Therapy Treatment  Patient Details  Name: Kimberly Hoover MRN: [id number] Date of Birth: 23-May-1961 Referring Provider (OT): Kimberly Hoover, [place]   Encounter Date: 10/04/2018  OT End of Session - 10/04/18 1540    Visit Number  7    Number of Visits  67    OT Start Time  0155    OT Stop Time  0315    OT Time Calculation (min)  80 min    Activity Tolerance  Patient tolerated treatment well;No increased pain    Behavior During Therapy  WFL for tasks assessed/performed       Past Medical History:  Diagnosis Date  . Otitis media   . Peripheral edema   . Sinusitis     Past Surgical History:  Procedure Laterality Date  . CHOLECYSTECTOMY    . KNEE SURGERY    . SHOULDER SURGERY      There were no vitals filed for this visit.  Subjective Assessment - 10/04/18 1533    Subjective   Kimberly Hoover presents for OT treatment visit 6/ 36 to address LLE lymphedema with compression wraps in place. Pt reports malleoli mads  fabricated and placed last week are tolerable until about mid day when they begin to feel like they are putting too much pressure on the ankle.    Pertinent History  pre-existing BLE swelling s/p episode of phlebitis in early 20's; Hx CVI; hx LE vein ablation (Laterality?); Vascular study 07/19/2018 ruled out DVT and VI;  "jumpy legs"; s/p EPF/ posterior tendon repair LLE 05/2018;     Limitations  difficulty walking, impaired transfers;     Repetition  Increases Symptoms    Special Tests  + Stemmer sign on L; - Stemmer on R    Patient Stated Goals  reduce swelling and pain and keep it from getting worse; improve ankle and foot AROM to limit fall risk    Currently in Pain?  Yes    Pain Score  --   ankle pain unchanged from initial eval.    Pain Location  Ankle    Pain Orientation  Left    Pain Onset  Other (comment)    05/2018   Pain Onset  --   chronic                  OT Treatments/Exercises (OP) - 10/04/18 0001      ADLs   ADL Education Given  Yes      Manual Therapy   Manual Therapy  Edema management;Manual Lymphatic Drainage (MLD);Compression Bandaging    Manual therapy comments  fabricated custom malliolarr chip pads today to replace orange solid foam pads    Manual Lymphatic Drainage (MLD)  MLD to LLE utilizing short neck sequence, deep abdominal pathways and functional ingunall watershed. Used gentle fibrosis techniques to medial and lateral malleoli. good tolerance    Compression Bandaging  Knee length wraps only today by Pt requestt. She reports poor tolerance for thigh length wraps after last visit. Fabricated custom   toe cap from [employer]  elastic   bandage on trial basis.             OT Education - 10/04/18 1538    Education Details  Pt edu for benefits of PT to regain  ankle ROM once LE Rx is complete. Pt edu re compression Estate [employer]  and  differences between compression and containment in flat and circular knit garments. Discussed recommendations and measurement and fitting n process    Person(s) Educated  Patient    Methods  Explanation;Demonstration;Handout;Verbal cues    Comprehension  Verbalized understanding;Returned demonstration;Verbal cues required          OT Long Term Goals - 09/25/18 1716      OT LONG TERM GOAL #1   Title  Pt will demonstrate understanding of lymphedema (LE) precautions / prevention principals, including signs / symptoms of cellulitis infection with modified independence  using LE Workbook to identify 6 LE precautions and all signs/ symptoms of cellulitis without verbal cues by end of 4th OT Rx visit.     Baseline  Max A    Time  4    Period  Days    Status  New      OT LONG TERM GOAL #2   Title  Pt will be able to apply knee length, multi-layer, short stretch compression wraps using correct gradient techniques  independently  to achieve optimal limb volume reduction, and to return affected limb , as closely as possible, to premorbid size.    Baseline  Max A Met 09/25/18    Time  4    Period  Days    Status  New      OT LONG TERM GOAL #3   Title  Pt to achieve 10% limb volume reduction in LLE below the knee, and 5% reduction in RLE below the knee   during Intensive Phase CDT to improve functional ambulation and mobility,  to improve tissue flexibility and AROM at ankle and foot,  and reduce infection risk.    Baseline  Max A    Time  12    Period  Weeks    Status  New      OT LONG TERM GOAL #4   Title  Pt will achieve and achieve and sustain at least 85% compliance with all daily LE self-care home program components throughout Intensive and Management Phase CDT, including impeccable skin care, simple self-MLD, gradient compression wrapping, and therapeutic exercise to ensure optimal limb volume reduction and to limit LE progression.    Baseline  Max A    Time  12    Period  Weeks    Status  New      OT LONG TERM GOAL #5   Title  Pt will be able to  don and doff compression garments/ HOS devices with modified independence using assistive devices PRN for optimal long-term management of BLE LE    Baseline  ax A    Time  12    Period  Weeks    Status  New      OT LONG TERM GOAL #6   Title  During self-management phase of CDT Pt will retain limb volume reductions achieved during Intensive Phase CDT with no more than 2% volume increase with ongoing CG assistance to limit LE progression and further functional decline.    Baseline  Max A    Time  6    Period  Months    Status  New              Patient will benefit from skilled therapeutic intervention in order to improve the following deficits and impairments:     Visit Diagnosis: Lymphedema, not elsewhere classified    Problem List Patient Active Problem List   Diagnosis Date Noted  . Closed nondisplaced fracture of proximal  phalanx of lesser toe of right foot 01/27/2017    Kimberly Spearman, MS, OTR/L, [facility] 10/04/18 3:42 PM  [place] MAIN [facility] SERVICES [address] [place], [place], [address] Phone: [phone]   Fax:  [phone]  Name: Kimberly Hoover MRN: [id number] Date of Birth: 1961/06/04

## 2018-10-08 ENCOUNTER — Ambulatory Visit: Payer: 59 | Admitting: Occupational Therapy

## 2018-10-11 ENCOUNTER — Ambulatory Visit: Payer: 59 | Admitting: Occupational Therapy

## 2018-10-15 ENCOUNTER — Other Ambulatory Visit: Payer: Self-pay

## 2018-10-15 ENCOUNTER — Ambulatory Visit: Payer: 59 | Admitting: Occupational Therapy

## 2018-10-15 DIAGNOSIS — I890 Lymphedema, not elsewhere classified: Secondary | ICD-10-CM | POA: Diagnosis not present

## 2018-10-16 NOTE — Therapy (Signed)
[place] MAIN [facility] SERVICES [address] [place], [place], [address] Phone: [phone]   Fax:  [phone]  Occupational Therapy Treatment  Patient Details  Name: Kimberly Hoover MRN: [id number] Date of Birth: 05-02-1961 Referring Provider (OT): Tyson Dense, [place]   Encounter Date: 10/15/2018  OT End of Session - 10/15/18 0941    Visit Number  8    Number of Visits  36    OT Start Time  0300    OT Stop Time  0410    OT Time Calculation (min)  70 min    Activity Tolerance  Patient tolerated treatment well;No increased pain    Behavior During Therapy  WFL for tasks assessed/performed       Past Medical History:  Diagnosis Date  . Otitis media   . Peripheral edema   . Sinusitis     Past Surgical History:  Procedure Laterality Date  . CHOLECYSTECTOMY    . KNEE SURGERY    . SHOULDER SURGERY      There were no vitals filed for this visit.  Subjective Assessment - 10/15/18 0935    Subjective   Kimberly Hoover presents for OT treatment visit 7/ 36 to address LLE lymphedema with compression wraps in place. Pt  reports increased pain at lateral L foot over the past week.    Pertinent History  pre-existing BLE swelling s/p episode of phlebitis in early 20's; Hx CVI; hx LE vein ablation (Laterality?); Vascular study 07/19/2018 ruled out DVT and VI;  "jumpy legs"; s/p EPF/ posterior tendon repair LLE 05/2018;     Limitations  difficulty walking, impaired transfers;     Repetition  Increases Symptoms    Special Tests  + Stemmer sign on L; - Stemmer on R    Patient Stated Goals  reduce swelling and pain and keep it from getting worse; improve ankle and foot AROM to limit fall risk    Currently in Pain?  Yes    Pain Score  --   not rated   Pain Location  Ankle    Pain Orientation  Left    Pain Descriptors / Indicators  Tender;Sore;Aching;Pressure;Discomfort;Tightness    Pain Type  Chronic pain    Pain Onset  Other (comment)   05/2018   Pain  Frequency  Other (Comment)   worse w weight bearing   Pain Onset  --   chronic                  OT Treatments/Exercises (OP) - 10/16/18 0001      ADLs   ADL Education Given  Yes      Manual Therapy   Manual Therapy  Edema management;Manual Lymphatic Drainage (MLD);Compression Bandaging    Edema Management  completed anatomical measurements for LLE custom compression stocking, toe cap and HOS device.    Manual Lymphatic Drainage (MLD)  MLD to LLE utilizing short neck sequence, deep abdominal pathways and functional ingunall watershed. Used gentle fibrosis techniques to medial and lateral malleoli. good tolerance    Compression Bandaging  Knee length wraps only today by Pt requestt. She reports poor tolerance for thigh length wraps after last visit. Fabricated custom   toe cap from [employer]  elastic   bandage on trial basis.             OT Education - 10/16/18 0939    Education Details  Cont Pt edu re custom compression garment and device options and recommendations throughout measurement process. Further discussed  DME ordering and fitting processes.    Person(s) Educated  Patient    Methods  Explanation;Demonstration;Handout;Verbal cues    Comprehension  Verbalized understanding;Returned demonstration;Verbal cues required          OT Long Term Goals - 09/25/18 1716      OT LONG TERM GOAL #1   Title  Pt will demonstrate understanding of lymphedema (LE) precautions / prevention principals, including signs / symptoms of cellulitis infection with modified independence  using LE Workbook to identify 6 LE precautions and all signs/ symptoms of cellulitis without verbal cues by end of 4th OT Rx visit.     Baseline  Max A    Time  4    Period  Days    Status  New      OT LONG TERM GOAL #2   Title  Pt will be able to apply knee length, multi-layer, short stretch compression wraps using correct gradient techniques independently  to achieve optimal limb volume reduction,  and to return affected limb , as closely as possible, to premorbid size.    Baseline  Max A Met 09/25/18    Time  4    Period  Days    Status  New      OT LONG TERM GOAL #3   Title  Pt to achieve 10% limb volume reduction in LLE below the knee, and 5% reduction in RLE below the knee   during Intensive Phase CDT to improve functional ambulation and mobility,  to improve tissue flexibility and AROM at ankle and foot,  and reduce infection risk.    Baseline  Max A    Time  12    Period  Weeks    Status  New      OT LONG TERM GOAL #4   Title  Pt will achieve and achieve and sustain at least 85% compliance with all daily LE self-care home program components throughout Intensive and Management Phase CDT, including impeccable skin care, simple self-MLD, gradient compression wrapping, and therapeutic exercise to ensure optimal limb volume reduction and to limit LE progression.    Baseline  Max A    Time  12    Period  Weeks    Status  New      OT LONG TERM GOAL #5   Title  Pt will be able to  don and doff compression garments/ HOS devices with modified independence using assistive devices PRN for optimal long-term management of BLE LE    Baseline  ax A    Time  12    Period  Weeks    Status  New      OT LONG TERM GOAL #6   Title  During self-management phase of CDT Pt will retain limb volume reductions achieved during Intensive Phase CDT with no more than 2% volume increase with ongoing CG assistance to limit LE progression and further functional decline.    Baseline  Max A    Time  6    Period  Months    Status  New            Plan - 10/15/18 0941    Clinical Impression Statement  Pt tolerated MLD with reported analgeic effect to lateral foot after session. Completed anatomical measurements for custom compression garments and devices, including ccl 3 Elvarex classic knee high, OT and 2.5 cm SB, ccl 2 Elavrex soft seamless toe cap, and [employer]st Relax, ccl 2 HOS devices necessary to  limit fibrosis formation  and LE progression during HOS. Cont to alternate custom malleolus pads  every other day.     Occupational performance deficits (Please refer to evaluation for details):  ADL's;Work;IADL's;Rest and Sleep;Leisure;Social Participation;Other   role performance, body image   Rehab Potential  Good    Clinical Decision Making  Several treatment options, min-mod task modification necessary    OT Frequency  3x / week    OT Duration  12 weeks    OT Treatment/Interventions  Self-care/ADL training;Therapeutic exercise;Gait Training;Therapeutic activities;DME and/or AE instruction;Manual Therapy;Compression bandaging;Patient/family education;Coping strategies training    OT Home Exercise Plan  BLE lymphatic pumping ther ex- 2 sets of 10 each in sequence    Recommended Other Services  fit w/ appropriate daytime compression garments- consider flat knit Elvarex classic for skin micromassage feature. Consider Jobst RELAX for HOS to soften scar and fibrosis, to  improve tissue flexibility and to limit fibrosis formation during HOS    Consulted and Agree with Plan of Care  Patient       Patient will benefit from skilled therapeutic intervention in order to improve the following deficits and impairments:     Visit Diagnosis: Lymphedema, not elsewhere classified    Problem List Patient Active Problem List   Diagnosis Date Noted  . Closed nondisplaced fracture of proximal phalanx of lesser toe of right foot 01/27/2017    Andrey Spearman, MS, OTR/L, [facility] 10/16/18 9:46 AM   [place] MAIN [facility] SERVICES [place], [place], [address] Phone: [phone]   Fax:  [phone]  Name: Kimberly Hoover MRN: [id number] Date of Birth: 01-16-1961

## 2018-10-18 ENCOUNTER — Ambulatory Visit: Payer: 59 | Admitting: Occupational Therapy

## 2018-10-18 ENCOUNTER — Other Ambulatory Visit: Payer: Self-pay

## 2018-10-18 DIAGNOSIS — I890 Lymphedema, not elsewhere classified: Secondary | ICD-10-CM | POA: Diagnosis not present

## 2018-10-18 NOTE — Therapy (Signed)
[place] MAIN [facility] SERVICES [address] [place], [place], [address] Phone: [phone]   Fax:  [phone]  Occupational Therapy Treatment  Patient Details  Name: Kimberly Hoover MRN: [id number] Date of Birth: 17-Feb-1961 Referring Provider (OT): Tyson Dense, [place]   Encounter Date: 10/18/2018  OT End of Session - 10/18/18 1732    Visit Number  9    Number of Visits  36    OT Start Time  0201    OT Stop Time  0316    OT Time Calculation (min)  75 min    Activity Tolerance  Patient tolerated treatment well;No increased pain    Behavior During Therapy  WFL for tasks assessed/performed       Past Medical History:  Diagnosis Date  . Otitis media   . Peripheral edema   . Sinusitis     Past Surgical History:  Procedure Laterality Date  . CHOLECYSTECTOMY    . KNEE SURGERY    . SHOULDER SURGERY      There were no vitals filed for this visit.  Subjective Assessment - 10/18/18 1727    Subjective   Kimberly Hoover presents for OT treatment visit 8/ 36 to address LLE lymphedema with compression wraps in place. Pt reports DME provider contacted her and informed her that her insurance has no benefits for custom compression garments.    Pertinent History  pre-existing BLE swelling s/p episode of phlebitis in early 20's; Hx CVI; hx LE vein ablation (Laterality?); Vascular study 07/19/2018 ruled out DVT and VI;  "jumpy legs"; s/p EPF/ posterior tendon repair LLE 05/2018;     Limitations  difficulty walking, impaired transfers;     Repetition  Increases Symptoms    Special Tests  + Stemmer sign on L; - Stemmer on R    Patient Stated Goals  reduce swelling and pain and keep it from getting worse; improve ankle and foot AROM to limit fall risk    Currently in Pain?  Yes    Pain Score  --   not rated   Pain Location  Foot    Pain Orientation  Left;Lateral    Pain Descriptors / Indicators  Aching;Tender;Sore;Tightness;Tiring;Discomfort;Heaviness    Pain  Onset  Other (comment)   05/2018   Pain Onset  --   chronic                  OT Treatments/Exercises (OP) - 10/18/18 0001      ADLs   ADL Education Given  Yes      Manual Therapy   Manual Therapy  Edema management;Manual Lymphatic Drainage (MLD);Compression Bandaging;Taping    Manual therapy comments  kinesio tape   using 2 two inch wide strips cut into 3 finger fan cuts. One fan anchored on posterior leg at ~ achilles origin and extending across scar tissue at lateral mlaleolus to lateral foot. Second strip anchorred at medial leg midway between knee and ankle and extending across top of foot to lateral asppect.    Manual Lymphatic Drainage (MLD)  MLD to LLE utilizing short neck sequence, deep abdominal pathways and functional ingunall watershed. Used gentle fibrosis techniques to medial and lateral malleoli. good tolerance    Compression Bandaging  Knee length wraps only today by Pt requestt. She reports poor tolerance for thigh length wraps after last visit. Fabricated custom   toe cap from [employer]  elastic   bandage on trial basis.  OT Education - 10/18/18 1732    Education Details  Pt edu for simple self MLD    Person(s) Educated  Patient    Methods  Explanation;Demonstration;Handout;Verbal cues    Comprehension  Verbalized understanding;Returned demonstration;Verbal cues required          OT Long Term Goals - 09/25/18 1716      OT LONG TERM GOAL #1   Title  Pt will demonstrate understanding of lymphedema (LE) precautions / prevention principals, including signs / symptoms of cellulitis infection with modified independence  using LE Workbook to identify 6 LE precautions and all signs/ symptoms of cellulitis without verbal cues by end of 4th OT Rx visit.     Baseline  Max A    Time  4    Period  Days    Status  New      OT LONG TERM GOAL #2   Title  Pt will be able to apply knee length, multi-layer, short stretch compression wraps using correct  gradient techniques independently  to achieve optimal limb volume reduction, and to return affected limb , as closely as possible, to premorbid size.    Baseline  Max A Met 09/25/18    Time  4    Period  Days    Status  New      OT LONG TERM GOAL #3   Title  Pt to achieve 10% limb volume reduction in LLE below the knee, and 5% reduction in RLE below the knee   during Intensive Phase CDT to improve functional ambulation and mobility,  to improve tissue flexibility and AROM at ankle and foot,  and reduce infection risk.    Baseline  Max A    Time  12    Period  Weeks    Status  New      OT LONG TERM GOAL #4   Title  Pt will achieve and achieve and sustain at least 85% compliance with all daily LE self-care home program components throughout Intensive and Management Phase CDT, including impeccable skin care, simple self-MLD, gradient compression wrapping, and therapeutic exercise to ensure optimal limb volume reduction and to limit LE progression.    Baseline  Max A    Time  12    Period  Weeks    Status  New      OT LONG TERM GOAL #5   Title  Pt will be able to  don and doff compression garments/ HOS devices with modified independence using assistive devices PRN for optimal long-term management of BLE LE    Baseline  ax A    Time  12    Period  Weeks    Status  New      OT LONG TERM GOAL #6   Title  During self-management phase of CDT Pt will retain limb volume reductions achieved during Intensive Phase CDT with no more than 2% volume increase with ongoing CG assistance to limit LE progression and further functional decline.    Baseline  Max A    Time  6    Period  Months    Status  New            Plan - 10/18/18 1733    Clinical Impression Statement  By end of session and skilled teaching Pt able to perform J stroke and simple self MLD to LLE and LLQ using functional inguinal LN with mod A. Pt tolerated manual therapy without increased pain. She stated that she felt  kinesiotape felt supportive. Cont as per POC. Pt agrees we should move forward on custom compression garment fitting ASAP.    Occupational performance deficits (Please refer to evaluation for details):  ADL's;Work;IADL's;Rest and Sleep;Leisure;Social Participation;Other   role performance, body image   Rehab Potential  Good    Clinical Decision Making  Several treatment options, min-mod task modification necessary    OT Frequency  3x / week    OT Duration  12 weeks    OT Treatment/Interventions  Self-care/ADL training;Therapeutic exercise;Gait Training;Therapeutic activities;DME and/or AE instruction;Manual Therapy;Compression bandaging;Patient/family education;Coping strategies training    OT Home Exercise Plan  BLE lymphatic pumping ther ex- 2 sets of 10 each in sequence    Recommended Other Services  fit w/ appropriate daytime compression garments- consider flat knit Elvarex classic for skin micromassage feature. Consider Jobst RELAX for HOS to soften scar and fibrosis, to  improve tissue flexibility and to limit fibrosis formation during HOS    Consulted and Agree with Plan of Care  Patient       Patient will benefit from skilled therapeutic intervention in order to improve the following deficits and impairments:     Visit Diagnosis: Lymphedema, not elsewhere classified    Problem List Patient Active Problem List   Diagnosis Date Noted  . Closed nondisplaced fracture of proximal phalanx of lesser toe of right foot 01/27/2017    Andrey Spearman, MS, OTR/L, [facility] 10/18/18 5:36 PM  [place] MAIN [facility] SERVICES [address] [place], [place], [address] Phone: [phone]   Fax:  [phone]  Name: Kimberly Hoover MRN: [id number] Date of Birth: 1961/06/29

## 2018-10-22 ENCOUNTER — Encounter: Payer: Self-pay | Admitting: Occupational Therapy

## 2018-10-22 ENCOUNTER — Encounter: Payer: 59 | Admitting: Occupational Therapy

## 2018-10-22 NOTE — Therapy (Signed)
Left VM for Pt stating I called to check in and discuss and questions and or concerns since rehab services have been interrupted by coronavirus shut down late last week. Left main OT rehab phone number for return call as needed.  Loel Dubonnet, MS, OTR/L, CLT-[facility]

## 2018-10-25 ENCOUNTER — Encounter: Payer: 59 | Admitting: Occupational Therapy

## 2018-10-29 ENCOUNTER — Encounter: Payer: 59 | Admitting: Occupational Therapy

## 2018-10-31 ENCOUNTER — Encounter: Payer: 59 | Admitting: Occupational Therapy

## 2018-11-01 ENCOUNTER — Encounter: Payer: 59 | Admitting: Occupational Therapy

## 2018-11-05 ENCOUNTER — Encounter: Payer: 59 | Admitting: Occupational Therapy

## 2018-11-07 ENCOUNTER — Encounter: Payer: 59 | Admitting: Occupational Therapy

## 2018-11-08 ENCOUNTER — Encounter: Payer: 59 | Admitting: Occupational Therapy

## 2018-11-12 ENCOUNTER — Encounter: Payer: 59 | Admitting: Occupational Therapy

## 2018-11-14 ENCOUNTER — Encounter: Payer: 59 | Admitting: Occupational Therapy

## 2018-11-15 ENCOUNTER — Encounter: Payer: 59 | Admitting: Occupational Therapy

## 2018-11-19 ENCOUNTER — Encounter: Payer: Self-pay | Admitting: Occupational Therapy

## 2018-11-19 ENCOUNTER — Encounter: Payer: 59 | Admitting: Occupational Therapy

## 2018-11-19 NOTE — Therapy (Signed)
Spoke with Mrs.Howrey by phone today in effort to provide ongoing "follow along" support for bilateral lower extremity lymphedema management and LE self-care home program during this extended absence from OT due to COVID-19 restrictions. Pt reports LE, pain decreased to 4/10 with CT  has increased to 7/10  since suspending OT. Pt continues to apply short stretch compression wraps to LLE daily using gradient pattern as instructed in clinic.  Pt is anxious to resume manual CDT when coronavirus restrictions are lifted.Pt in agreement with plan to resume OT ASAP and she will report any signs/ symptoms of infection, aka cellulitis, to her PCP.OT will continue to check in periodically to facilitate ongoing compliance w/ LE self care home program.  Loel Dubonnet, MS, OTR/L, [facility] 11/19/18 3:18 PM

## 2018-11-21 ENCOUNTER — Encounter: Payer: 59 | Admitting: Occupational Therapy

## 2018-11-22 ENCOUNTER — Encounter: Payer: 59 | Admitting: Occupational Therapy

## 2018-12-04 ENCOUNTER — Encounter: Payer: Self-pay | Admitting: Occupational Therapy

## 2018-12-04 ENCOUNTER — Other Ambulatory Visit: Payer: Self-pay

## 2018-12-04 ENCOUNTER — Ambulatory Visit: Payer: 59 | Attending: Podiatry | Admitting: Occupational Therapy

## 2018-12-04 DIAGNOSIS — I890 Lymphedema, not elsewhere classified: Secondary | ICD-10-CM | POA: Insufficient documentation

## 2018-12-04 NOTE — Patient Instructions (Signed)
Perform scar massage and desensitization techniques to L medial ankle sx scar daily.  Wear compression anklet daily.   Cont to perform AROM there ex all planes. Can perform  Seated edge of pool with foot in water.

## 2018-12-04 NOTE — Therapy (Signed)
[place] MAIN [facility] SERVICES [address] [place], [place], [address] Phone: [phone]   Fax:  [phone]  Occupational Therapy Treatment Note and Progress Report  Patient Details  Name: Kimberly Hoover MRN: [id number] Date of Birth: 24-Jun-1961 Referring Provider (OT): Tyson Dense, [place]   Encounter Date: 12/04/2018  OT End of Session - 12/04/18 1434    Visit Number  10    Number of Visits  45    OT Start Time  0100    OT Stop Time  0215    OT Time Calculation (min)  75 min    Activity Tolerance  Patient tolerated treatment well    Behavior During Therapy  [facility] for tasks assessed/performed       Past Medical History:  Diagnosis Date  . Otitis media   . Peripheral edema   . Sinusitis     Past Surgical History:  Procedure Laterality Date  . CHOLECYSTECTOMY    . KNEE SURGERY    . SHOULDER SURGERY      There were no vitals filed for this visit.  Subjective Assessment - 12/04/18 1421    Subjective   Kimberly Hoover returns to OT treatment visit 10/ 36 to address LLE lymphedema with ccl 2 Mediven anklet in place. Pt was last seen 3/19 when OT was interrupted 2/2 covid 19 restrictions.  Pt reports today she stopped using wraps on her LL       ankle last week. She reports she has been using  deep tissue massager 0on meial and lateral ankle for the last few days to soften skin. and increase jointn movement.    Pertinent History  pre-existing BLE swelling s/p episode of phlebitis in early 20's; Hx CVI; hx LE vein ablation (Laterality?); Vascular study 07/19/2018 ruled out DVT and VI;  "jumpy legs"; s/p EPF/ posterior tendon repair LLE 05/2018;     Limitations  difficulty walking, impaired transfers;     Repetition  Increases Symptoms    Special Tests  + Stemmer sign on L; - Stemmer on R    Patient Stated Goals  reduce swelling and pain and keep it from getting worse; improve ankle and foot AROM to limit fall risk    Currently in Pain?  Yes   not  rated numerically. sx scar hyper sensativity   Pain Location  Ankle    Pain Orientation  Left    Pain Descriptors / Indicators  Other (Comment)    Pain Type  Chronic pain    Pain Onset  Other (comment)   05/2018   Pain Frequency  --   w palpation and scar massage   Multiple Pain Sites  Yes    Pain Location  Ankle    Pain Orientation  Left    Pain Descriptors / Indicators  Tender;Sore;Discomfort;Heaviness;Tightness;Tingling;Tiring;Pins and needles    Pain Type  Chronic pain    Pain Radiating Towards  --   neuropathic?   Pain Onset  Other (comment)   chronic- post sx                  OT Treatments/Exercises (OP) - 12/04/18 0001      ADLs   ADL Education Given  Yes      Manual Therapy   Manual Therapy  Edema management;Soft tissue mobilization;Manual Lymphatic Drainage (MLD);Passive ROM;Other (comment)   scar massage   Manual Lymphatic Drainage (MLD)  MLD to LLE utilizing short neck sequence, deep abdominal pathways and functional ingunall watershed. Used  gentle fibrosis techniques to medial and lateral malleoli. good tolerance    Compression Bandaging  silicone gel sheet to sx scar. no wraps today. utilized ccl 2 anklet issued by referring MD    Passive ROM  L ankle- all planes    Other Manual Therapy  scar massage and palpation w/ various textured materials to reduce hpersensativity             OT Education - 12/04/18 1432    Education Details  Pt edu re scar sensativity and desensatization techniques    Person(s) Educated  Patient    Methods  Explanation;Demonstration;Handout;Verbal cues    Comprehension  Verbalized understanding;Returned demonstration;Verbal cues required          OT Long Term Goals - 12/04/18 1441      OT LONG TERM GOAL #1   Title  Pt will demonstrate understanding of lymphedema (LE) precautions / prevention principals, including signs / symptoms of cellulitis infection with modified independence  using LE Workbook to identify 6  LE precautions and all signs/ symptoms of cellulitis without verbal cues by end of 4th OT Rx visit.     Baseline  Max A    Time  4    Period  Days    Status  Achieved      OT LONG TERM GOAL #2   Title  Pt will be able to apply knee length, multi-layer, short stretch compression wraps using correct gradient techniques independently  to achieve optimal limb volume reduction, and to return affected limb , as closely as possible, to premorbid size.    Baseline  Max A Met 09/25/18    Time  4    Period  Days    Status  Achieved      OT LONG TERM GOAL #3   Title  Pt to achieve 10% limb volume reduction in LLE below the knee, and 5% reduction in RLE below the knee   during Intensive Phase CDT to improve functional ambulation and mobility,  to improve tissue flexibility and AROM at ankle and foot,  and reduce infection risk.    Baseline  Max A    Time  12    Period  Weeks    Status  Partially Met      OT LONG TERM GOAL #4   Title  Pt will achieve and achieve and sustain at least 85% compliance with all daily LE self-care home program components throughout Intensive and Management Phase CDT, including impeccable skin care, simple self-MLD, gradient compression wrapping, and therapeutic exercise to ensure optimal limb volume reduction and to limit LE progression.    Baseline  Max A    Time  12    Period  Weeks    Status  Achieved      OT LONG TERM GOAL #5   Title  Pt will be able to  don and doff compression garments/ HOS devices with modified independence using assistive devices PRN for optimal long-term management of BLE LE    Baseline  ax A    Time  12    Period  Weeks    Status  Achieved      OT LONG TERM GOAL #6   Title  During self-management phase of CDT Pt will retain limb volume reductions achieved during Intensive Phase CDT with no more than 2% volume increase with ongoing CG assistance to limit LE progression and further functional decline.    Baseline  Max A    Time  6    Period   Months    Status  On-going            Plan - 12/04/18 1435    Clinical Impression Statement  Pt demonstrates nmoderate increase in ankle strength and AROM in all planes today since last seen on 3/19. Fibrotis tissue density at lateral foot/ ankle and fatty fibrosis at medial foot/ankle is mildly decreased today. Neuropatic symptoms persist at alteral foot extending drom heel to toes. Pt reports pain in ankle overall s decreased, but scar hypersensativity has increased. Pt tolerated short intervals of MLD and scar massage to medial ankle only. She tolerated deep fibrosis techniques and MLD to lateral foot / ankle without difficulty. Applied silicone gel sheet to surgical scar to increase scar mobility and reduce hypersensativity. Pt instructed to continue with compression anklet  daily and with self massage no less than 1 x daily. Pt will continue OT 1 x weekly for next few weeks in effort to further desensatize scar and improve standing/ walking tolerance and further dec rease swelling and tissue density.l    Occupational performance deficits (Please refer to evaluation for details):  ADL's;Work;IADL's;Rest and Sleep;Leisure;Social Participation;Other   role performance, body image   Rehab Potential  Good    Clinical Decision Making  Several treatment options, min-mod task modification necessary    OT Frequency  3x / week    OT Duration  12 weeks    OT Treatment/Interventions  Self-care/ADL training;Therapeutic exercise;Gait Training;Therapeutic activities;DME and/or AE instruction;Manual Therapy;Compression bandaging;Patient/family education;Coping strategies training    OT Home Exercise Plan  BLE lymphatic pumping ther ex- 2 sets of 10 each in sequence    Recommended Other Services  fit w/ appropriate daytime compression garments- consider flat knit Elvarex classic for skin micromassage feature. Consider Jobst RELAX for HOS to soften scar and fibrosis, to  improve tissue flexibility and to  limit fibrosis formation during HOS    Consulted and Agree with Plan of Care  Patient       Patient will benefit from skilled therapeutic intervention in order to improve the following deficits and impairments:     Visit Diagnosis: Lymphedema, not elsewhere classified    Problem List Patient Active Problem List   Diagnosis Date Noted  . Closed nondisplaced fracture of proximal phalanx of lesser toe of right foot 01/27/2017    Andrey Spearman, MS, OTR/L, [facility] 12/04/18 2:44 PM  [place] MAIN [facility] SERVICES [address] [place], [place], [address] Phone: [phone]   Fax:  [phone]  Name: Kimberly Hoover MRN: [id number] Date of Birth: [birth year]

## 2018-12-06 ENCOUNTER — Encounter: Payer: 59 | Admitting: Occupational Therapy

## 2018-12-07 IMAGING — MR MR ANKLE*L* W/O CM
4 of 5 series · 13 of 40 positions shown · non-contrast
Comparison: Radiographs 12/06/2016

CLINICAL DATA: Chronic foot pain.

EXAM:
MRI OF THE LEFT ANKLE WITHOUT CONTRAST
TECHNIQUE: Multiplanar, multisequence MR imaging of the ankle was performed. No
intravenous contrast was administered.

[Series 3: PD fat-sat · axial · left · 3.0mm · 0.25mm/px · z∈[-80,+20]mm · 4 of 30 slices shown]
[im 1/30]
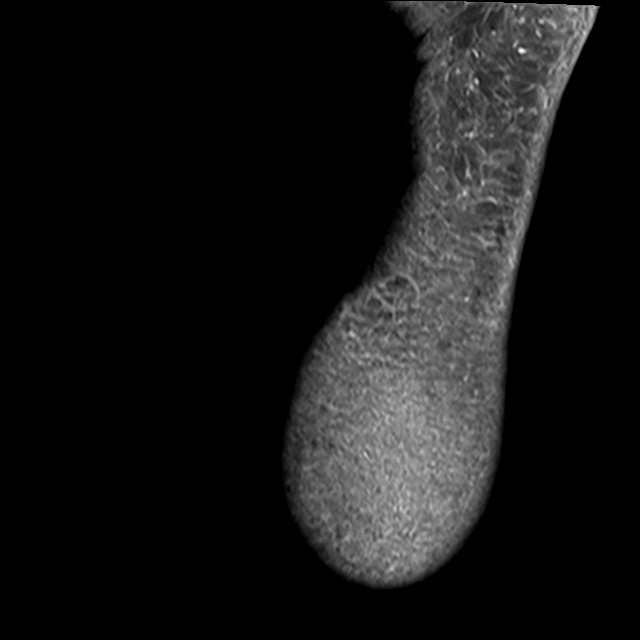
[im 4/30]
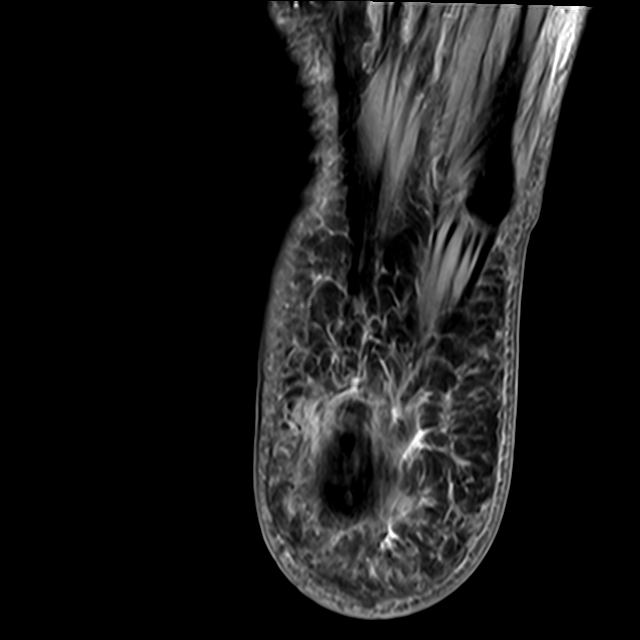
[im 15/30]
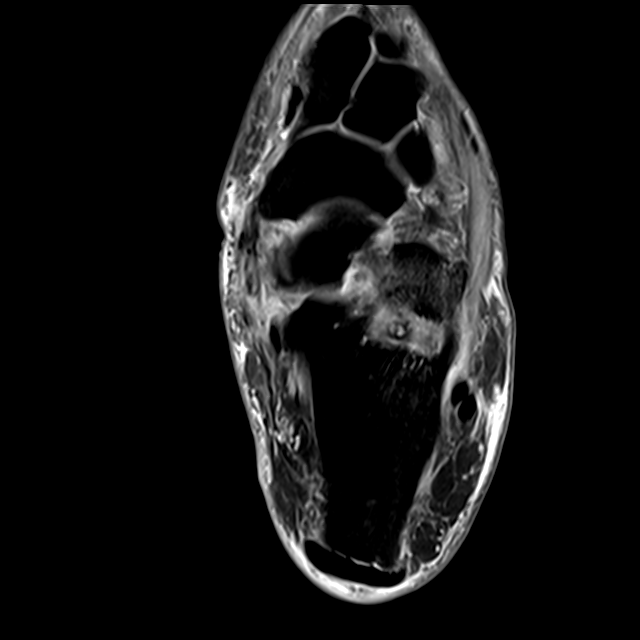
[im 26/30]
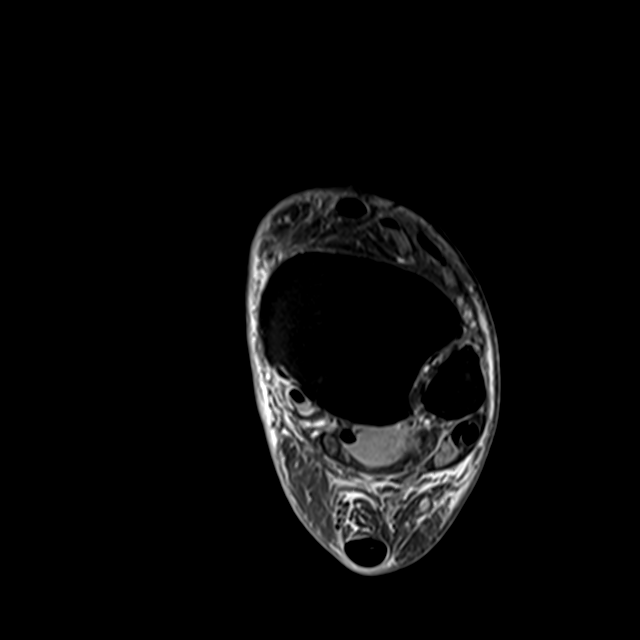

[Series 4: T2 fat-sat · axial · left · 3.0mm · 0.25mm/px · z∈[-68,+20]mm · 3 of 30 slices shown (1 of 2)]
[im 4/30]
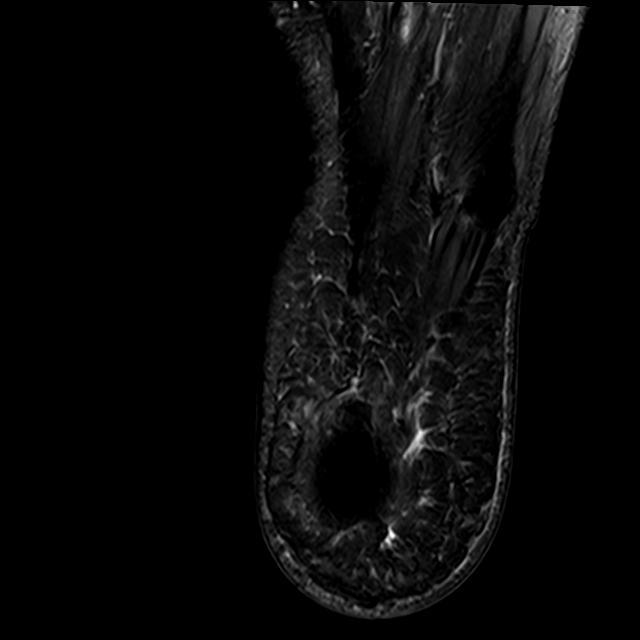
[im 15/30]
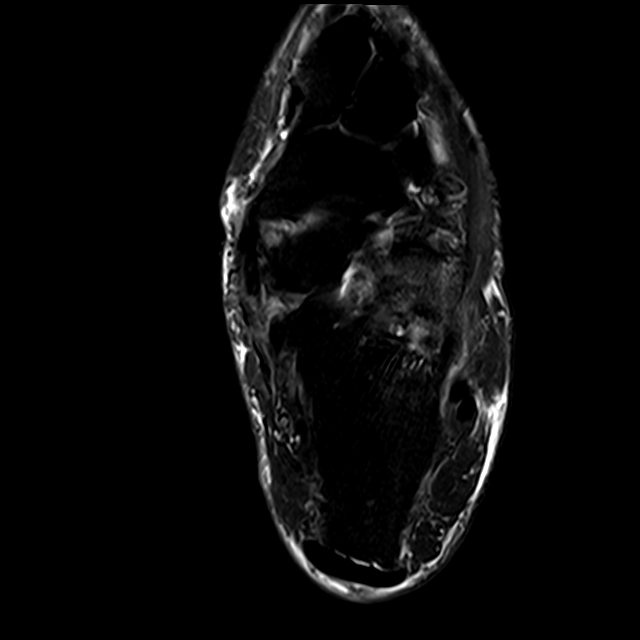
[im 26/30]
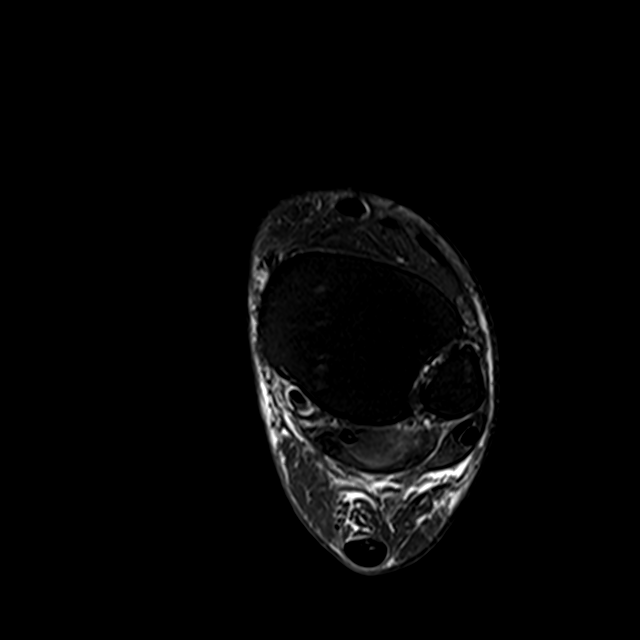

[Series 5: T1 · sagittal · left · 4.0mm · 0.27mm/px · 3 of 17 slices shown]
[im 1/17]
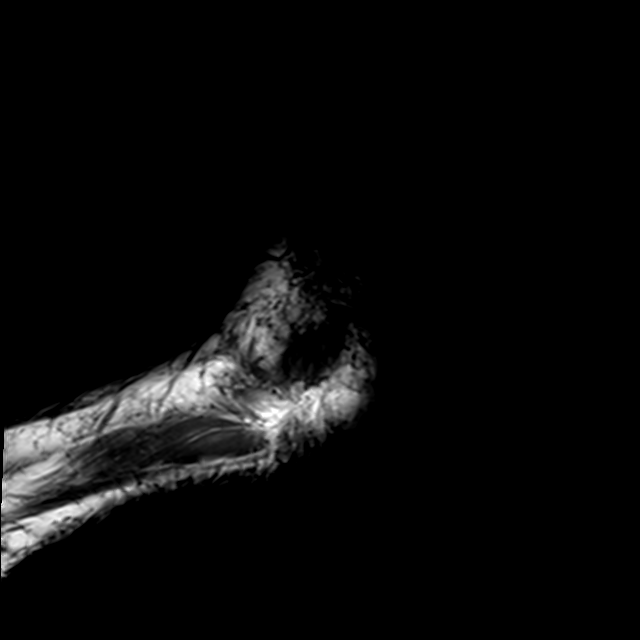
[im 9/17]
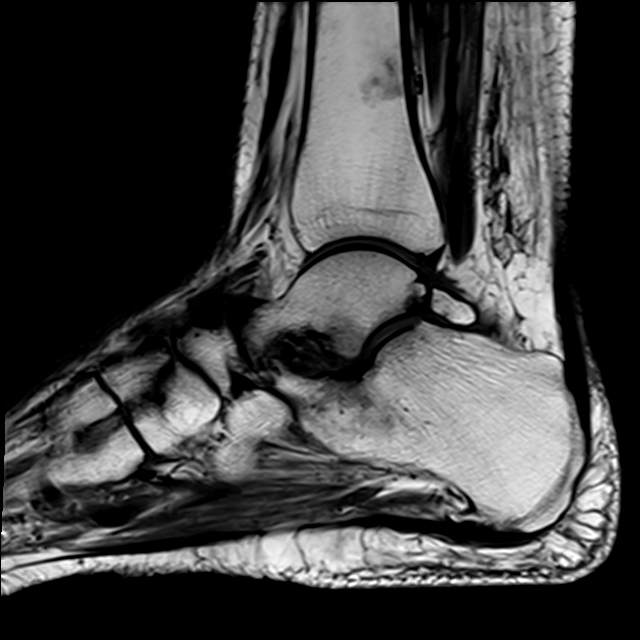
[im 17/17]
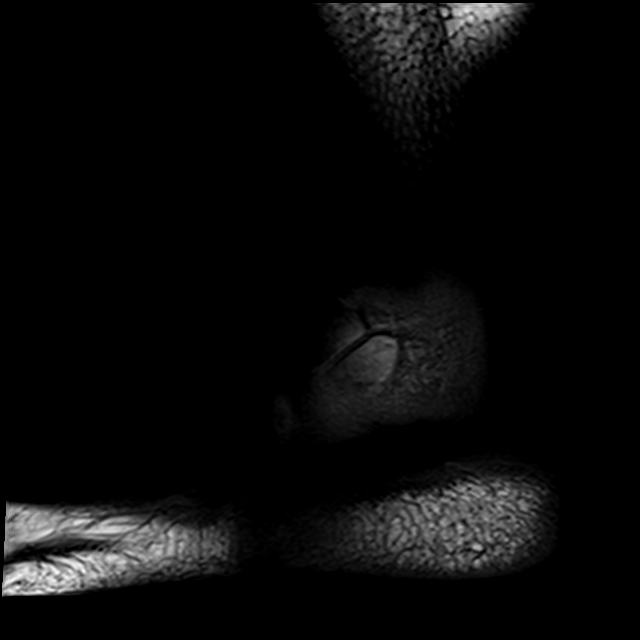

[Series 7: T2 fat-sat · coronal · left · 3.0mm · 0.25mm/px · 3 of 40 slices shown (2 of 2)]
[im 8/40]
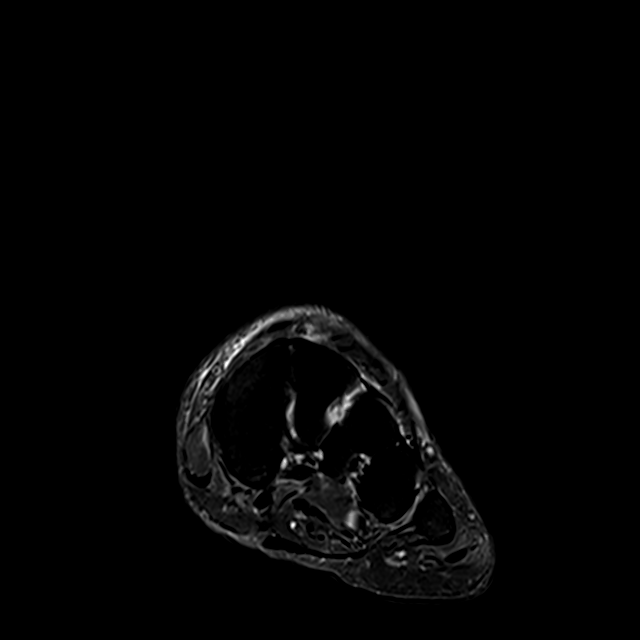
[im 22/40]
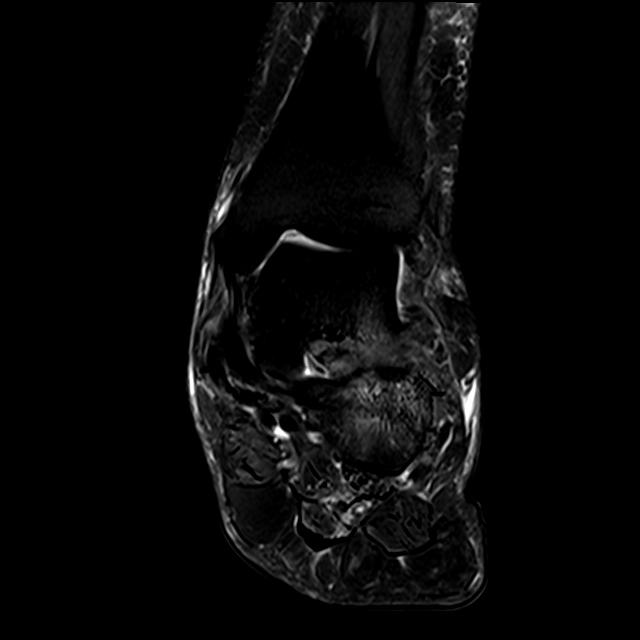
[im 36/40]
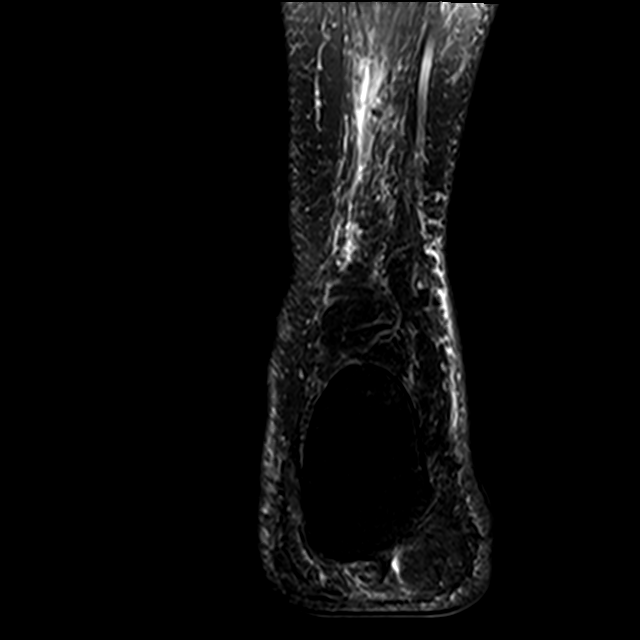

[13 of 40 positions shown; findings below may reference images not displayed]

FINDINGS: TENDONS

Peroneal: Moderate tendinopathy and interstitial tears involving the
peroneus longus tendon as it courses under the calcaneus and cuboid.
No rupture. The peroneus brevis tendon is intact. Mild tendinopathy.

Posteromedial: Chronically ruptured posterior tibialis tendon.
Distally the tendon is markedly thickened and demonstrates
significant tendinopathy. Proximally I see only a few wispy fibers.
The FHL and BACH tendons are intact.

Anterior: Intact

Achilles: Intact.  Minimal tendinopathy.

Plantar Fascia: Slight thickening and increased T2 signal intensity
in the plantar fascia at near its attachment site suggesting plantar
fasciitis. There is also a small calcaneal heel spur. Mild edema
like signal abnormality is also noted in the adjacent short flexor
muscles.

LIGAMENTS

Lateral: Intact

Medial: Intact

CARTILAGE

Ankle Joint: Mild degenerative changes but no full-thickness
cartilage defects, osteochondral lesion or joint effusion. Os
trigonum is noted but no findings for os trigonum syndrome.

Subtalar Joints/Sinus Tarsi: Moderate degenerative changes involving
the subtalar joints with subchondral cystic change and marrow edema.
There is also mild edema/inflammation in the sinus tarsi but the
cervical and interosseous ligaments appear intact. The spring
ligament is intact.

Bones: No stress fracture or AVN. Mild midfoot degenerative changes.

Other: None.
IMPRESSION: 1. Moderate tendinopathy with interstitial tears involving the
peroneus longus but no rupture.
2. Chronically ruptured posterior tibialis tendon.
3. Findings consistent with plantar fasciitis and associated
inflammation/edema in the short flexor muscles.
4. Moderate subtalar joint degenerative changes with subchondral
cystic change and marrow edema.
5. No stress fracture or osteochondral lesions.

## 2018-12-11 ENCOUNTER — Ambulatory Visit: Payer: 59 | Admitting: Occupational Therapy

## 2018-12-11 ENCOUNTER — Other Ambulatory Visit: Payer: Self-pay

## 2018-12-11 ENCOUNTER — Encounter: Payer: Self-pay | Admitting: Occupational Therapy

## 2018-12-11 DIAGNOSIS — I890 Lymphedema, not elsewhere classified: Secondary | ICD-10-CM | POA: Diagnosis not present

## 2018-12-11 NOTE — Therapy (Signed)
[place] MAIN [facility] SERVICES [address] [place], [place], [address] Phone: [phone]   Fax:  [phone]  Occupational Therapy Treatment Note and Discharge Summary  Patient Details  Name: Kimberly Hoover MRN: [id number] Date of Birth: 06/09/1961 Referring Provider (OT): Tyson Dense, [place]   Encounter Date: 12/11/2018  OT End of Session - 12/11/18 1649    Visit Number  11    Number of Visits  75    OT Start Time  0120    OT Stop Time  0215    OT Time Calculation (min)  55 min    Activity Tolerance  Patient tolerated treatment well;No increased pain    Behavior During Therapy  WFL for tasks assessed/performed       Past Medical History:  Diagnosis Date  . Otitis media   . Peripheral edema   . Sinusitis     Past Surgical History:  Procedure Laterality Date  . CHOLECYSTECTOMY    . KNEE SURGERY    . SHOULDER SURGERY      There were no vitals filed for this visit.  Subjective Assessment - 12/11/18 1634    Subjective   Kimberly Hoover returns to OT treatment visit 11/ 36 to address LLE lymphedema with ccl 2 Mediven anklet in place. Pt reports   she used vibrating deep massager 1 x since last visit on 5/7.  Reviewed before photographs witrh Pt and discussed plan gouing forward throughout session.    Pertinent History  pre-existing BLE swelling s/p episode of phlebitis in early 20's; Hx CVI; hx LE vein ablation (Laterality?); Vascular study 07/19/2018 ruled out DVT and VI;  "jumpy legs"; s/p EPF/ posterior tendon repair LLE 05/2018;     Limitations  difficulty walking, impaired transfers;     Repetition  Increases Symptoms    Special Tests  + Stemmer sign on L; - Stemmer on R    Patient Stated Goals  reduce swelling and pain and keep it from getting worse; improve ankle and foot AROM to limit fall risk    Currently in Pain?  Yes    Pain Score  --   not rated numerically. Pt reports p-ain in L ankle is decreased overall since initially  commencing OT for LE care, but pain persists when transitioning position from sit to stand and when weightbearing   Pain Location  Ankle    Pain Orientation  Left    Pain Descriptors / Indicators  Tender;Numbness;Pressure;Sore;Tightness;Radiating;Heaviness;Discomfort;Squeezing;Tiring;Pins and needles;Grimacing    Pain Type  Chronic pain    Pain Onset  Other (comment)   05/2018   Pain Frequency  Intermittent    Pain Onset  Other (comment)   chronic- post sx         LYMPHEDEMA/ONCOLOGY QUESTIONNAIRE - 12/11/18 1636      Left Lower Extremity Lymphedema   Other  LLE ( dominant, Rx) A-D limb volume measures 4202.34 ml.. Limb volume is increased by 2.54% since initially measured on 09/10/18.    Other  LVD is decreased from 6.61 R>L, initially to 3.96%, L>R              OT Treatments/Exercises (OP) - 12/11/18 0001      ADLs   ADL Education Given  Yes      Manual Therapy   Manual Therapy  Edema management    Manual Lymphatic Drainage (MLD)  MLD to LLE utilizing short neck sequence, deep abdominal pathways and functional ingunall watershed. Used gentle fibrosis techniques to medial  and lateral malleoli. good tolerance    Compression Bandaging  ccl 2 anklet to LLE    Other Manual Therapy  scar massage and palpation w/ various textured materials to reduce hpersensativity             OT Education - 12/11/18 1645    Education Details  Reviewed progress towards goals. Reviewed comparative volumetric results. Educated Pt re cold laser therapy and possible benefits for long term orthopedic swelling and related fibrosis. Encouraged Pt to discuss with referring physician at follow up. Encouraged Pt to complete lymphatic pumping ther ex   in pool when possible.    Person(s) Educated  Patient    Methods  Explanation;Demonstration;Handout;Verbal cues    Comprehension  Verbalized understanding;Returned demonstration;Verbal cues required          OT Long Term Goals - 12/11/18  1650      OT LONG TERM GOAL #1   Title  Pt will demonstrate understanding of lymphedema (LE) precautions / prevention principals, including signs / symptoms of cellulitis infection with modified independence  using LE Workbook to identify 6 LE precautions and all signs/ symptoms of cellulitis without verbal cues by end of 4th OT Rx visit.     Baseline  Max A    Time  4    Period  Days    Status  Achieved      OT LONG TERM GOAL #2   Title  Pt will be able to apply knee length, multi-layer, short stretch compression wraps using correct gradient techniques independently  to achieve optimal limb volume reduction, and to return affected limb , as closely as possible, to premorbid size.    Baseline  Max A Met 09/25/18    Time  4    Period  Days    Status  Achieved      OT LONG TERM GOAL #3   Title  Pt to achieve 10% limb volume reduction in LLE below the knee, and 5% reduction in RLE below the knee   during Intensive Phase CDT to improve functional ambulation and mobility,  to improve tissue flexibility and AROM at ankle and foot,  and reduce infection risk.    Baseline  Max A 12/11/18: LLE volume below knee is increased 2.54% since initially measures on 09/10/18, most likely due to increased muscle mass since increasing activity level    Time  12    Period  Weeks    Status  Partially Met      OT LONG TERM GOAL #4   Title  Pt will achieve and achieve and sustain at least 85% compliance with all daily LE self-care home program components throughout Intensive and Management Phase CDT, including impeccable skin care, simple self-MLD, gradient compression wrapping, and therapeutic exercise to ensure optimal limb volume reduction and to limit LE progression.    Baseline  Max A    Time  12    Period  Weeks    Status  Achieved      OT LONG TERM GOAL #5   Title  Pt will be able to  don and doff compression garments/ HOS devices with modified independence using assistive devices PRN for optimal  long-term management of BLE LE    Baseline  ax A    Time  12    Period  Weeks    Status  Achieved      OT LONG TERM GOAL #6   Title  During self-management phase of CDT Pt will retain  limb volume reductions achieved during Intensive Phase CDT with no more than 2% volume increase with ongoing CG assistance to limit LE progression and further functional decline.    Baseline  Max A    Time  6    Period  Months    Status  On-going            Plan - 12/11/18 1653    Clinical Impression Statement  Pt demonstrates moderate increase in ankle strength and AROM in all planes since commencing OT. She feels these attributes have increased more rapidly since DC compression wraps to anklet, which also allows for more mobility overall.  Fibrotis around [facility] is mildly softer since commencing OT , but persists Lateral >  medially. Encouraged Pt to use custom fabricated chicp pad on malleoli to continue to soften fibrosis over tiime. Limb volumetrics reveal overall increase in L limb volume below the knee. Distribution shows decreased volume from midcalf to ankle and foot and increased volume from calf up.  Muscle mass in calf is increased overall since commencing therapy which deceptively leads to the conclusion that leg is more swelen when distal leg and foot swelling is in fact decreased. Volumetric reduction does not meet goal range. Pt has met all goals and progress  has slowed. Pt is agreement with plan to schedule follow up with referring physician  and to discuss ongoing joint pain. Pt encouraged to explore cold laser therapy and aquatic therapy at home  to retain clincal gains with AROM and strength while limiting swelling. Pt is discharged from OT today. I shared my contact info with her and am happy to assist in any way in the future.    Occupational performance deficits (Please refer to evaluation for details):  ADL's;Work;IADL's;Leisure;Social Participation;Other   role performance, body image    Rehab Potential  Good    Clinical Decision Making  Several treatment options, min-mod task modification necessary    OT Frequency  3x / week    OT Duration  12 weeks    OT Treatment/Interventions  Self-care/ADL training;Therapeutic exercise;Gait Training;Therapeutic activities;DME and/or AE instruction;Manual Therapy;Compression bandaging;Patient/family education;Coping strategies training    OT Home Exercise Plan  BLE lymphatic pumping ther ex- 2 sets of 10 each in sequence    Recommended Other Services  fit w/ appropriate daytime compression garments- consider flat knit Elvarex classic for skin micromassage feature. Consider Jobst RELAX for HOS to soften scar and fibrosis, to  improve tissue flexibility and to limit fibrosis formation during HOS    Consulted and Agree with Plan of Care  Patient       Patient will benefit from skilled therapeutic intervention in order to improve the following deficits and impairments:     Visit Diagnosis: Lymphedema, not elsewhere classified    Problem List Patient Active Problem List   Diagnosis Date Noted  . Closed nondisplaced fracture of proximal phalanx of lesser toe of right foot 01/27/2017    Kimberly Spearman, MS, OTR/L, [facility] 12/11/18 5:10 PM  [place] MAIN [facility] SERVICES [address] [place], [place], [address] Phone: [phone]   Fax:  [phone]  Name: Kimberly Hoover MRN: [id number] Date of Birth: 07/14/1961

## 2018-12-11 NOTE — Patient Instructions (Signed)

## 2018-12-13 ENCOUNTER — Encounter: Payer: 59 | Admitting: Occupational Therapy

## 2018-12-18 ENCOUNTER — Encounter: Payer: 59 | Admitting: Occupational Therapy

## 2018-12-20 ENCOUNTER — Encounter: Payer: 59 | Admitting: Occupational Therapy

## 2018-12-25 ENCOUNTER — Encounter: Payer: 59 | Admitting: Occupational Therapy

## 2018-12-27 ENCOUNTER — Encounter: Payer: 59 | Admitting: Occupational Therapy

## 2019-01-01 ENCOUNTER — Encounter: Payer: 59 | Admitting: Occupational Therapy

## 2019-01-03 ENCOUNTER — Encounter: Payer: 59 | Admitting: Occupational Therapy

## 2019-07-31 ENCOUNTER — Ambulatory Visit: Payer: 59 | Attending: Internal Medicine

## 2019-07-31 DIAGNOSIS — Z20822 Contact with and (suspected) exposure to covid-19: Secondary | ICD-10-CM

## 2019-08-01 LAB — NOVEL CORONAVIRUS, NAA: SARS-CoV-2, NAA: NOT DETECTED

## 2020-03-12 ENCOUNTER — Encounter: Payer: Self-pay | Admitting: Genetic Counselor

## 2020-04-01 ENCOUNTER — Other Ambulatory Visit: Payer: Self-pay

## 2020-08-01 DIAGNOSIS — U071 COVID-19: Secondary | ICD-10-CM | POA: Insufficient documentation

## 2020-08-01 HISTORY — DX: COVID-19: U07.1

## 2020-08-18 ENCOUNTER — Ambulatory Visit (HOSPITAL_COMMUNITY)
Admission: RE | Admit: 2020-08-18 | Discharge: 2020-08-18 | Disposition: A | Discharge status: Home / Self Care | Payer: 59 | Source: Ambulatory Visit | Attending: Pulmonary Disease | Admitting: Pulmonary Disease

## 2020-08-18 ENCOUNTER — Encounter: Payer: Self-pay | Admitting: Nurse Practitioner

## 2020-08-18 ENCOUNTER — Other Ambulatory Visit: Payer: Self-pay | Admitting: Nurse Practitioner

## 2020-08-18 DIAGNOSIS — Z720 Tobacco use: Secondary | ICD-10-CM | POA: Diagnosis not present

## 2020-08-18 DIAGNOSIS — U071 COVID-19: Secondary | ICD-10-CM | POA: Insufficient documentation

## 2020-08-18 MED ORDER — SODIUM CHLORIDE 0.9 % IV SOLN: INTRAVENOUS | Status: DC | PRN

## 2020-08-18 MED ORDER — SOTROVIMAB 500 MG/8ML IV SOLN
500.0000 mg | Freq: Once | INTRAVENOUS | Status: AC
  Administered 2020-08-18: 500 mg via INTRAVENOUS

## 2020-08-18 MED ORDER — DIPHENHYDRAMINE HCL 50 MG/ML IJ SOLN: 50.0000 mg | Freq: Once | INTRAMUSCULAR | Status: DC | PRN

## 2020-08-18 MED ORDER — ALBUTEROL SULFATE HFA 108 (90 BASE) MCG/ACT IN AERS: 2.0000 | INHALATION_SPRAY | Freq: Once | RESPIRATORY_TRACT | Status: DC | PRN

## 2020-08-18 MED ORDER — ACETAMINOPHEN 325 MG PO TABS
650.0000 mg | ORAL_TABLET | Freq: Once | ORAL | Status: AC
  Administered 2020-08-18: 650 mg via ORAL
  Filled 2020-08-18: qty 2

## 2020-08-18 MED ORDER — FAMOTIDINE IN NACL 20-0.9 MG/50ML-% IV SOLN: 20.0000 mg | Freq: Once | INTRAVENOUS | Status: DC | PRN

## 2020-08-18 MED ORDER — METHYLPREDNISOLONE SODIUM SUCC 125 MG IJ SOLR: 125.0000 mg | Freq: Once | INTRAMUSCULAR | Status: DC | PRN

## 2020-08-18 MED ORDER — EPINEPHRINE 0.3 MG/0.3ML IJ SOAJ: 0.3000 mg | Freq: Once | INTRAMUSCULAR | Status: DC | PRN

## 2020-08-18 NOTE — Progress Notes (Signed)
Patient reviewed Fact Sheet for Patients, Parents, and Caregivers for Emergency Use Authorization (EUA) of sotrovimab for the Treatment of Coronavirus. Patient also reviewed and is agreeable to the estimated cost of treatment. Patient is agreeable to proceed.   

## 2020-08-18 NOTE — Progress Notes (Signed)
I connected by phone with Kimberly Hoover on 08/18/2020 at 11:07 AM to discuss the potential use of a new treatment for mild to moderate COVID-19 viral infection in non-hospitalized patients.  This patient is a 60 y.o. female that meets the FDA criteria for Emergency Use Authorization of COVID monoclonal antibody sotrovimab.  Has a (+) direct SARS-CoV-2 viral test result  Has mild or moderate COVID-19   Is NOT hospitalized due to COVID-19  Is within 10 days of symptom onset  Has at least one of the high risk factor(s) for progression to severe COVID-19 and/or hospitalization as defined in EUA.  Specific high risk criteria : Chronic Lung Disease   I have spoken and communicated the following to the patient or parent/caregiver regarding COVID monoclonal antibody treatment:  1. FDA has authorized the emergency use for the treatment of mild to moderate COVID-19 in adults and pediatric patients with positive results of direct SARS-CoV-2 viral testing who are 41 years of age and older weighing at least 40 kg, and who are at high risk for progressing to severe COVID-19 and/or hospitalization.  2. The significant known and potential risks and benefits of COVID monoclonal antibody, and the extent to which such potential risks and benefits are unknown.  3. Information on available alternative treatments and the risks and benefits of those alternatives, including clinical trials.  4. Patients treated with COVID monoclonal antibody should continue to self-isolate and use infection control measures (e.g., wear mask, isolate, social distance, avoid sharing personal items, clean and disinfect "high touch" surfaces, and frequent handwashing) according to CDC guidelines.   5. The patient or parent/caregiver has the option to accept or refuse COVID monoclonal antibody treatment.  After reviewing this information with the patient, the patient has agreed to receive one of the available covid 19 monoclonal  antibodies and will be provided an appropriate fact sheet prior to infusion.  [name], NP 08/18/2020 11:07 AM

## 2020-08-18 NOTE — Progress Notes (Signed)
Diagnosis: COVID-19  Physician: Dr. Patrick Wright  Procedure: [facility] Infusion [facility] Med: Sotrovimab infusion - Provided patient with sotrovimab fact sheet for patients, parents, and caregivers prior to infusion.   Complications: No immediate complications noted  Discharge: Discharged home    

## 2020-08-18 NOTE — Discharge Instructions (Signed)

## 2021-11-05 ENCOUNTER — Emergency Department (HOSPITAL_BASED_OUTPATIENT_CLINIC_OR_DEPARTMENT_OTHER)
Admission: EM | Admit: 2021-11-05 | Discharge: 2021-11-05 | Disposition: A | Discharge status: Home / Self Care | Payer: 59 | Attending: Emergency Medicine | Admitting: Emergency Medicine

## 2021-11-05 ENCOUNTER — Other Ambulatory Visit: Payer: Self-pay

## 2021-11-05 ENCOUNTER — Encounter (HOSPITAL_BASED_OUTPATIENT_CLINIC_OR_DEPARTMENT_OTHER): Payer: Self-pay | Admitting: Emergency Medicine

## 2021-11-05 ENCOUNTER — Emergency Department (HOSPITAL_BASED_OUTPATIENT_CLINIC_OR_DEPARTMENT_OTHER): Payer: 59

## 2021-11-05 DIAGNOSIS — Z79899 Other long term (current) drug therapy: Secondary | ICD-10-CM | POA: Insufficient documentation

## 2021-11-05 DIAGNOSIS — N132 Hydronephrosis with renal and ureteral calculous obstruction: Secondary | ICD-10-CM | POA: Insufficient documentation

## 2021-11-05 DIAGNOSIS — Z7982 Long term (current) use of aspirin: Secondary | ICD-10-CM | POA: Insufficient documentation

## 2021-11-05 DIAGNOSIS — N201 Calculus of ureter: Secondary | ICD-10-CM

## 2021-11-05 DIAGNOSIS — R1032 Left lower quadrant pain: Secondary | ICD-10-CM | POA: Diagnosis present

## 2021-11-05 LAB — CBC
HCT: 42.3 % (ref 36.0–46.0)
Hemoglobin: 14.3 g/dL (ref 12.0–15.0)
MCH: 31.7 pg (ref 26.0–34.0)
MCHC: 33.8 g/dL (ref 30.0–36.0)
MCV: 93.8 fL (ref 80.0–100.0)
Platelets: 255 10*3/uL (ref 150–400)
RBC: 4.51 MIL/uL (ref 3.87–5.11)
RDW: 12.8 % (ref 11.5–15.5)
WBC: 7.6 10*3/uL (ref 4.0–10.5)
nRBC: 0 % (ref 0.0–0.2)

## 2021-11-05 LAB — COMPREHENSIVE METABOLIC PANEL
ALT: 19 U/L (ref 0–44)
AST: 19 U/L (ref 15–41)
Albumin: 4.2 g/dL (ref 3.5–5.0)
Alkaline Phosphatase: 52 U/L (ref 38–126)
Anion gap: 8 (ref 5–15)
BUN: 16 mg/dL (ref 8–23)
CO2: 25 mmol/L (ref 22–32)
Calcium: 9.7 mg/dL (ref 8.9–10.3)
Chloride: 105 mmol/L (ref 98–111)
Creatinine, Ser: 0.62 mg/dL (ref 0.44–1.00)
GFR, Estimated: >60 mL/min (ref >60)
Glucose, Bld: 132 mg/dL — ABNORMAL HIGH (ref 70–99)
Potassium: 4 mmol/L (ref 3.5–5.1)
Sodium: 138 mmol/L (ref 135–145)
Total Bilirubin: 0.5 mg/dL (ref 0.3–1.2)
Total Protein: 6.9 g/dL (ref 6.5–8.1)

## 2021-11-05 LAB — URINALYSIS, ROUTINE W REFLEX MICROSCOPIC
Bilirubin Urine: NEGATIVE
Glucose, UA: NEGATIVE mg/dL
Ketones, ur: NEGATIVE mg/dL
Leukocytes,Ua: NEGATIVE
Nitrite: NEGATIVE
Protein, ur: NEGATIVE mg/dL
Specific Gravity, Urine: 1.045 — ABNORMAL HIGH (ref 1.005–1.030)
pH: 6 (ref 5.0–8.0)

## 2021-11-05 LAB — LIPASE, BLOOD: Lipase: 21 U/L (ref 11–51)

## 2021-11-05 MED ORDER — ONDANSETRON HCL 4 MG PO TABS: 4.0000 mg | ORAL_TABLET | Freq: Four times a day (QID) | ORAL | 0 refills | Status: DC

## 2021-11-05 MED ORDER — TAMSULOSIN HCL 0.4 MG PO CAPS: 0.4000 mg | ORAL_CAPSULE | Freq: Every day | ORAL | 0 refills | Status: DC

## 2021-11-05 MED ORDER — ONDANSETRON HCL 4 MG/2ML IJ SOLN
4.0000 mg | Freq: Once | INTRAMUSCULAR | Status: AC
  Administered 2021-11-05: 4 mg via INTRAVENOUS
  Filled 2021-11-05: qty 2

## 2021-11-05 MED ORDER — NAPROXEN 500 MG PO TABS: 500.0000 mg | ORAL_TABLET | Freq: Two times a day (BID) | ORAL | 0 refills | Status: DC

## 2021-11-05 MED ORDER — SODIUM CHLORIDE 0.9 % IV BOLUS
1000.0000 mL | Freq: Once | INTRAVENOUS | Status: AC
  Administered 2021-11-05: 1000 mL via INTRAVENOUS

## 2021-11-05 MED ORDER — OXYCODONE-ACETAMINOPHEN 5-325 MG PO TABS: 1.0000 | ORAL_TABLET | Freq: Four times a day (QID) | ORAL | 0 refills | Status: DC | PRN

## 2021-11-05 MED ORDER — KETOROLAC TROMETHAMINE 15 MG/ML IJ SOLN
15.0000 mg | Freq: Once | INTRAMUSCULAR | Status: AC
  Administered 2021-11-05: 15 mg via INTRAVENOUS
  Filled 2021-11-05: qty 1

## 2021-11-05 MED ORDER — FENTANYL CITRATE PF 50 MCG/ML IJ SOSY
50.0000 ug | PREFILLED_SYRINGE | Freq: Once | INTRAMUSCULAR | Status: AC
  Administered 2021-11-05: 50 ug via INTRAVENOUS
  Filled 2021-11-05: qty 1

## 2021-11-05 MED ORDER — IOHEXOL 300 MG/ML  SOLN
100.0000 mL | Freq: Once | INTRAMUSCULAR | Status: AC | PRN
  Administered 2021-11-05: 80 mL via INTRAVENOUS

## 2021-11-05 NOTE — ED Notes (Signed)
Pt unable to provide UA at this time // will re-attempt after pt receives IVF ?

## 2021-11-05 NOTE — ED Triage Notes (Addendum)
Sine 9pm last night abd pain and nausea that started LLQ and has moved midline. No emesis or fever, has recently been out of the country - [place]. Had diarrhea while in [place]. ?Last PO intake 8pm. ?Last BM last night. ?No h/o abd surgery.  ?

## 2021-11-05 NOTE — ED Notes (Signed)
EDP at bedside  

## 2021-11-05 NOTE — ED Notes (Signed)
Pt denies being able to provide UA at this time // will attempt to re-collect  ?

## 2021-11-05 NOTE — ED Provider Notes (Signed)
? ?[facility]  ?Provider Note ? ?CSN: [id number] ?Arrival date & time: 11/05/21 0537 ? ?History ?Chief Complaint  ?Patient presents with  ? Abdominal Pain  ? ? ?Kimberly Hoover is a 61 y.o. female reports sudden onset of LLQ and L flank pain during the night. Pain waxed and waned some but has been constant. Not improved with lying still. No fever. She has been nauseated but not vomiting. She has felt the need to have a BM but nothing has come out. No recent dysuria, hematuria or frequency. No prior history of same. Has had colonoscopy but unsure if she might have diverticulosis.  ? ? ?Home Medications ?Prior to Admission medications   ?Medication Sig Start Date End Date Taking? Authorizing Provider  ?naproxen (NAPROSYN) 500 MG tablet Take 1 tablet (500 mg total) by mouth 2 (two) times daily. 11/05/21  Yes Pollyann Savoy, MD  ?oxyCODONE-acetaminophen (PERCOCET/ROXICET) 5-325 MG tablet Take 1 tablet by mouth every 6 (six) hours as needed for severe pain. 11/05/21  Yes Pollyann Savoy, MD  ?tamsulosin (FLOMAX) 0.4 MG CAPS capsule Take 1 capsule (0.4 mg total) by mouth daily. 11/05/21  Yes Pollyann Savoy, MD  ?aspirin EC 81 MG tablet Take by mouth.    [provider]  ?bifidobacterium infantis (ALIGN) capsule Take by mouth.    [provider]  ?Calcium Carb-Ergocalciferol 500-200 MG-UNIT TABS Take by mouth.    [provider]  ?Cholecalciferol (VITAMIN D-1000 MAX ST) 1000 units tablet Take by mouth.    [provider]  ?hydrochlorothiazide (HYDRODIURIL) 25 MG tablet Take by mouth. 07/24/18   [provider]  ?valACYclovir (VALTREX) 1000 MG tablet Take 1,000 mg by mouth. 08/05/16   [provider]  ? ? ? ?Allergies    ?Azithromycin and Fosamax [alendronate] ? ? ?Review of Systems   ?Review of Systems ?Please see HPI for pertinent positives and negatives ? ?Physical Exam ?BP (!) 152/94 (BP Location: Right Arm)   Pulse 74   Temp 98.1 ?F  (36.7 ?C) (Oral)   Resp 19   Wt 73.5 kg   SpO2 97%  ? ?Physical Exam ?Vitals and nursing note reviewed.  ?Constitutional:   ?   Appearance: Normal appearance.  ?HENT:  ?   Head: Normocephalic and atraumatic.  ?   Nose: Nose normal.  ?   Mouth/Throat:  ?   Mouth: Mucous membranes are moist.  ?Eyes:  ?   Extraocular Movements: Extraocular movements intact.  ?   Conjunctiva/sclera: Conjunctivae normal.  ?Cardiovascular:  ?   Rate and Rhythm: Normal rate.  ?Pulmonary:  ?   Effort: Pulmonary effort is normal.  ?   Breath sounds: Normal breath sounds.  ?Abdominal:  ?   General: Abdomen is flat.  ?   Palpations: Abdomen is soft.  ?   Tenderness: There is abdominal tenderness (mild) in the left lower quadrant. There is left CVA tenderness. There is no guarding. Negative signs include Murphy's sign.  ?Musculoskeletal:     ?   General: No swelling. Normal range of motion.  ?   Cervical back: Neck supple.  ?Skin: ?   General: Skin is warm and dry.  ?Neurological:  ?   General: No focal deficit present.  ?   Mental Status: She is alert.  ?Psychiatric:     ?   Mood and Affect: Mood normal.  ? ? ?ED Results / Procedures / Treatments   ?EKG ?None ? ?Procedures ?Procedures ? ?Medications Ordered in the  ED ?Medications  ?fentaNYL (SUBLIMAZE) injection 50 mcg (50 mcg Intravenous Given 11/05/21 0601)  ?ondansetron [facility]) injection 4 mg (4 mg Intravenous Given 11/05/21 0601)  ?sodium chloride 0.9 % bolus 1,000 mL (1,000 mLs Intravenous New Bag/Given 11/05/21 0600)  ?iohexol (OMNIPAQUE) 300 MG/ML solution 100 mL (80 mLs Intravenous Contrast Given 11/05/21 0639)  ? ? ?Initial Impression and Plan ? Patient with LLQ/L flank pain. Suspect renal stone vs diverticulitis. Will check labs and send for CT. Pain/nausea meds, IVF for comfort.  ? ?ED Course  ? ?Clinical Course as of 11/05/21 0714  ?Fri Nov 05, 2021  ?0557 CBC is normal.  [CS]  ?7893 CMP and Lipase are normal.  [CS]  ?30 I personally viewed the images from radiology studies and agree  with radiologist interpretation: CT shows distal ureteral stone. Discussed this with patient. She has not yet made a urine specimen. Care will be signed out to Dr. Fredderick Phenix at the change of shift pending UA.  ? [CS]  ?  ?Clinical Course User Index ?[CS] Pollyann Savoy, MD  ? ? ? ?MDM Rules/Calculators/A&P ?Medical Decision Making ?Problems Addressed: ?Left ureteral stone: acute illness or injury ? ?Amount and/or Complexity of Data Reviewed ?Labs: ordered. Decision-making details documented in ED Course. ?Radiology: ordered and independent interpretation performed. Decision-making details documented in ED Course. ? ?Risk ?Prescription drug management. ?Parenteral controlled substances. ? ? ? ?Final Clinical Impression(s) / ED Diagnoses ?Final diagnoses:  ?Left ureteral stone  ? ? ?Rx / DC Orders ?ED Discharge Orders   ? ?      Ordered  ?  oxyCODONE-acetaminophen (PERCOCET/ROXICET) 5-325 MG tablet  Every 6 hours PRN       ? 11/05/21 0713  ?  naproxen (NAPROSYN) 500 MG tablet  2 times daily       ? 11/05/21 0713  ?  tamsulosin (FLOMAX) 0.4 MG CAPS capsule  Daily       ? 11/05/21 0713  ? ?  ?  ? ?  ? ?  ?Pollyann Savoy, MD ?11/05/21 [phone] ? ?

## 2021-11-05 NOTE — ED Provider Notes (Signed)
Patient care taken over from Dr. Bernette Mayers.  Her urinalysis is not consistent with infection.  Her pain is well controlled.  She was discharged home in good condition.  Was given prescriptions per Dr. Bernette Mayers.  She was advised to follow-up with [facility].  Return precautions were given. ?  Rolan Bucco, MD ?11/05/21 [phone] ? ?

## 2021-11-05 NOTE — ED Notes (Signed)
Patient transported to CT 

## 2022-07-04 NOTE — Progress Notes (Unsigned)
[facility] [address] [place] [address] Phone: [phone] Subjective:   Bruce Donath, am serving as a scribe for Dr. Antoine Primas.  I'm seeing this patient by the request  of:  Tracey Harries, MD  CC: Right knee pain and swelling  [id number]  Kimberly Hoover is a 61 y.o. female coming in with complaint of B knee pain, R>L. Knee is swollen and painful if she lies on L side. Likes to walk and has 4 grandkids. Pain over medial aspect.   Looking for preventative measures. Has had tendon transplant in her foot, lateral released for knee. Just found out that she has compression fx in her back. Tried Fosamax but this caused her knee pain to become constant.  Used it D and Calcium to increase bone density.     Past Medical History:  Diagnosis Date   COVID-19 virus infection 08/2020   Otitis media    Peripheral edema    Sinusitis    Tobacco abuse    Vapes nicotine containing substance    Past Surgical History:  Procedure Laterality Date   CHOLECYSTECTOMY     KNEE SURGERY     SHOULDER SURGERY     Social History   Socioeconomic History   Marital status: Married    Spouse name: Not on file   Number of children: Not on file   Years of education: Not on file   Highest education level: Not on file  Occupational History   Not on file  Tobacco Use   Smoking status: Every Day    Packs/day: 1.00    Types: Cigarettes   Smokeless tobacco: Never  Substance and Sexual Activity   Alcohol use: Yes   Drug use: No   Sexual activity: Not on file  Other Topics Concern   Not on file  Social History Narrative   Not on file   Social Determinants of Health   Financial Resource Strain: Not on file  Food Insecurity: Not on file  Transportation Needs: Not on file  Physical Activity: Not on file  Stress: Not on file  Social Connections: Not on file   Allergies  Allergen Reactions   Azithromycin Tinitus   Fosamax [Alendronate] Other (See  Comments)    Body aches   No family history on file.   Current Outpatient Medications (Cardiovascular):    hydrochlorothiazide (HYDRODIURIL) 25 MG tablet, Take by mouth.   Current Outpatient Medications (Analgesics):    aspirin EC 81 MG tablet, Take by mouth.   naproxen (NAPROSYN) 500 MG tablet, Take 1 tablet (500 mg total) by mouth 2 (two) times daily.   oxyCODONE-acetaminophen (PERCOCET/ROXICET) 5-325 MG tablet, Take 1 tablet by mouth every 6 (six) hours as needed for severe pain.   Current Outpatient Medications (Other):    bifidobacterium infantis (ALIGN) capsule, Take by mouth.   Calcium Carb-Ergocalciferol 500-200 MG-UNIT TABS, Take by mouth.   Cholecalciferol (VITAMIN D-1000 MAX ST) 1000 units tablet, Take by mouth.   ondansetron (ZOFRAN) 4 MG tablet, Take 1 tablet (4 mg total) by mouth every 6 (six) hours.   tamsulosin (FLOMAX) 0.4 MG CAPS capsule, Take 1 capsule (0.4 mg total) by mouth daily.   valACYclovir (VALTREX) 1000 MG tablet, Take 1,000 mg by mouth.   Reviewed prior external information including notes and imaging from  primary care provider As well as notes that were available from care everywhere and other healthcare systems.  Past medical history, social, surgical and family history all reviewed  in electronic medical record.  No pertanent information unless stated regarding to the chief complaint.   Review of Systems:  No headache, visual changes, nausea, vomiting, diarrhea, constipation, dizziness, abdominal pain, skin rash, fevers, chills, night sweats, weight loss, swollen lymph nodes, body aches, joint swelling, chest pain, shortness of breath, mood changes. POSITIVE muscle aches  Objective  There were no vitals taken for this visit.   General: No apparent distress alert and oriented x3 mood and affect normal, dressed appropriately.  HEENT: Pupils equal, extraocular movements intact  Respiratory: Patient's speak in full sentences and does not appear short  of breath  Cardiovascular: No lower extremity edema, non tender, no erythema  Right knee exam does show trace effusion noted.  Crepitus noted.  Lateral tracking of the patella noted.  Patient does have tenderness to palpation over the joint line.  Limited muscular skeletal ultrasound was performed and interpreted by Antoine Primas, M  Limited ultrasound of patient's right knee shows that there is what appears to be a degenerative meniscal tear noted with mild displacement.  Patient also has what appears to be a hypoechoic changes with calcific changes on the inside that is consistent with a possible parameniscal calcific cyst.  No abnormal blood flow.  Patient does have significant narrowing of the patellofemoral joint with trace effusion noted as well. 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  Verbal informed consent obtained.  Time-out conducted.  Noted no overlying erythema, induration, or other signs of local infection.  Skin prepped in a sterile fashion.  Local anesthesia: Topical Ethyl chloride.  With sterile technique and under real time ultrasound guidance: With a 21-gauge 2 inch needle injected on the medial aspect of the knee with a total of 2 cc of 0.5% Marcaine and 1 cc of Kenalog 40 mg/mL under ultrasound into the cyst. Completed without difficulty  Pain immediately resolved suggesting accurate placement of the medication.  Advised to call if fevers/chills, erythema, induration, drainage, or persistent bleeding.  Impression: Technically successful ultrasound guided injection.    Impression and  Recommendations:     The above documentation has been reviewed and is accurate and complete Judi Saa, DO

## 2022-07-05 ENCOUNTER — Ambulatory Visit (INDEPENDENT_AMBULATORY_CARE_PROVIDER_SITE_OTHER): Payer: 59

## 2022-07-05 ENCOUNTER — Ambulatory Visit: Payer: Self-pay

## 2022-07-05 ENCOUNTER — Ambulatory Visit: Payer: 59 | Admitting: Family Medicine

## 2022-07-05 VITALS — BP 102/64 | HR 98 | Ht 61.0 in

## 2022-07-05 DIAGNOSIS — M25561 Pain in right knee: Secondary | ICD-10-CM

## 2022-07-05 DIAGNOSIS — M1712 Unilateral primary osteoarthritis, left knee: Secondary | ICD-10-CM | POA: Diagnosis not present

## 2022-07-05 DIAGNOSIS — M23007 Cystic meniscus, unspecified meniscus, left knee: Secondary | ICD-10-CM | POA: Diagnosis not present

## 2022-07-05 DIAGNOSIS — M1711 Unilateral primary osteoarthritis, right knee: Secondary | ICD-10-CM

## 2022-07-05 NOTE — Patient Instructions (Addendum)
Injected knee today Xray R knee today Exercises 3x a week See me in 6 weeks

## 2022-07-06 ENCOUNTER — Encounter: Payer: Self-pay | Admitting: Family Medicine

## 2022-07-06 DIAGNOSIS — M23007 Cystic meniscus, unspecified meniscus, left knee: Secondary | ICD-10-CM | POA: Insufficient documentation

## 2022-07-06 DIAGNOSIS — M1711 Unilateral primary osteoarthritis, right knee: Secondary | ICD-10-CM | POA: Insufficient documentation

## 2022-07-06 NOTE — Assessment & Plan Note (Signed)
Patient given injection.  Likely secondary to good degenerative meniscal tear noted on the medial aspect.  Discussed posture and ergonomics, discussed which activities to do and which ones to avoid.  Discussed with patient that there is underlying arthritis that is also contributing.  Worsening pain will consider advanced imaging but I think it is highly unlikely.  I discussed oral anti-inflammatories as well and given home exercises.  Follow-up again in 4 to 8 weeks

## 2022-07-06 NOTE — Assessment & Plan Note (Addendum)
Noted as well.  Will continue to monitor.  We discussed potential bracing but at the moment patient would like to try to work on the home exercises first and see how patient responds.  X-rays are pending.  Need this to further evaluate the bony abnormalities and any loose bodies that could be contributing to patient's symptomatology.

## 2022-07-15 ENCOUNTER — Ambulatory Visit: Payer: 59 | Admitting: Family Medicine

## 2022-08-10 NOTE — Progress Notes (Signed)
[facility] [place] Phone: [phone] Subjective:    I'm seeing this patient by the request  of:  Bernerd Limbo, MD  CC: bilateral knee pain   [id number]  07/05/2022 Noted as well.  Will continue to monitor.  We discussed potential bracing but at the moment patient would like to try to work on the home exercises first and see how patient responds.  X-rays are pending.  Need this to further evaluate the bony abnormalities and any loose bodies that could be contributing to patient's symptomatology.      Kimberly Hoover is a 62 y.o. female coming in with complaint of B knee pain. Patient states that the left knee is doing better than it was. Has been able to walk 2 miles and eliptical 2 miles yesterday with out anything too bad happening. The right knee is still swollen and is still giving her trouble but not stopping her from her walks. Patient still cannot sleep with her knees touching.        Past Medical History:  Diagnosis Date   COVID-19 virus infection 08/2020   Otitis media    Peripheral edema    Sinusitis    Tobacco abuse    Vapes nicotine containing substance    Past Surgical History:  Procedure Laterality Date   CHOLECYSTECTOMY     KNEE SURGERY     SHOULDER SURGERY     Social History   Socioeconomic History   Marital status: Married    Spouse name: Not on file   Number of children: Not on file   Years of education: Not on file   Highest education level: Not on file  Occupational History   Not on file  Tobacco Use   Smoking status: Every Day    Packs/day: 1.00    Types: Cigarettes   Smokeless tobacco: Never  Substance and Sexual Activity   Alcohol use: Yes   Drug use: No   Sexual activity: Not on file  Other Topics Concern   Not on file  Social History Narrative   Not on file   Social Determinants of Health   Financial Resource Strain: Not on file  Food Insecurity: Not on file   Transportation Needs: Not on file  Physical Activity: Not on file  Stress: Not on file  Social Connections: Not on file   Allergies  Allergen Reactions   Azithromycin Tinitus   Fosamax [Alendronate] Other (See Comments)    Body aches   No family history on file.   Current Outpatient Medications (Cardiovascular):    hydrochlorothiazide (HYDRODIURIL) 25 MG tablet, Take by mouth.   Current Outpatient Medications (Analgesics):    aspirin EC 81 MG tablet, Take by mouth.   naproxen (NAPROSYN) 500 MG tablet, Take 1 tablet (500 mg total) by mouth 2 (two) times daily.   oxyCODONE-acetaminophen (PERCOCET/ROXICET) 5-325 MG tablet, Take 1 tablet by mouth every 6 (six) hours as needed for severe pain.   Current Outpatient Medications (Other):    bifidobacterium infantis (ALIGN) capsule, Take by mouth.   Calcium Carb-Ergocalciferol 500-200 MG-UNIT TABS, Take by mouth.   Cholecalciferol (VITAMIN D-1000 MAX ST) 1000 units tablet, Take by mouth.   ondansetron (ZOFRAN) 4 MG tablet, Take 1 tablet (4 mg total) by mouth every 6 (six) hours.   tamsulosin (FLOMAX) 0.4 MG CAPS capsule, Take 1 capsule (0.4 mg total) by mouth daily.   valACYclovir (VALTREX) 1000 MG tablet, Take 1,000 mg by mouth.  Reviewed prior external information including notes and imaging from  primary care provider As well as notes that were available from care everywhere and other healthcare systems.  Past medical history, social, surgical and family history all reviewed in electronic medical record.  No pertanent information unless stated regarding to the chief complaint.   Review of Systems:  No headache, visual changes, nausea, vomiting, diarrhea, constipation, dizziness, abdominal pain, skin rash, fevers, chills, night sweats, weight loss, swollen lymph nodes, body aches, joint swelling, chest pain, shortness of breath, mood changes. POSITIVE muscle aches  Objective  Blood pressure 104/64, pulse 60, height 5\' 10"   (1.778 m), weight 150 lb (68 kg), SpO2 99 %.   General: No apparent distress alert and oriented x3 mood and affect normal, dressed appropriately.  HEENT: Pupils equal, extraocular movements intact  Respiratory: Patient's speak in full sentences and does not appear short of breath  Cardiovascular: No lower extremity edema, non tender, no erythema  Patient seen examined does have some mild tenderness over the medial joint line but significant improvement in range of motion.  Limited muscular skeletal ultrasound was performed and interpreted by Hulan Saas, M  Patient does have significant improvement in the hypoechoic changes that were the cyst previously.  I do not see those at this moment.  Medial meniscus appear to be significantly improved as well with the positioning.  Impression: Interval improvement   Impression and Recommendations:     The above documentation has been reviewed and is accurate and complete [name], DO

## 2022-08-16 ENCOUNTER — Ambulatory Visit: Payer: Self-pay

## 2022-08-16 ENCOUNTER — Ambulatory Visit: Payer: 59 | Admitting: Family Medicine

## 2022-08-16 VITALS — BP 104/64 | HR 60 | Ht 70.0 in | Wt 150.0 lb

## 2022-08-16 DIAGNOSIS — M25561 Pain in right knee: Secondary | ICD-10-CM

## 2022-08-16 DIAGNOSIS — M23007 Cystic meniscus, unspecified meniscus, left knee: Secondary | ICD-10-CM

## 2022-08-16 NOTE — Patient Instructions (Signed)
Good to see you   Follow up in  

## 2022-08-16 NOTE — Assessment & Plan Note (Signed)
Patient has done very well.  Still has some of the calcific changes but is doing better.  Discussed with icing regimen and home exercises.  Discussed which activities to do and which ones to avoid.  Increase activity slowly otherwise.  Patient does not have any locking or giving out on her.  Does have a known arthritic changes that we will need to continue to monitor.  Follow-up as needed

## 2022-10-18 NOTE — Progress Notes (Unsigned)
[place] Phone: [phone] Subjective:    I'm seeing this patient by the request  of:  Bernerd Limbo, MD  CC: Left knee pain  [id number]  08/16/2022 Patient has done very well. Still has some of the calcific changes but is doing better. Discussed with icing regimen and home exercises. Discussed which activities to do and which ones to avoid. Increase activity slowly otherwise. Patient does not have any locking or giving out on her. Does have a known arthritic changes that we will need to continue to monitor. Follow-up as needed   Update 10/19/2022 Kimberly Hoover is a 62 y.o. female coming in with complaint of R knee pain. Patient states that she has been walking an additional mile when she exercises. Pain worse when she is on uneven surfaces. Pain moves throughout the joint. Leaving for [place] and would like to figure out      Past Medical History:  Diagnosis Date   COVID-19 virus infection 08/2020   Otitis media    Peripheral edema    Sinusitis    Tobacco abuse    Vapes nicotine containing substance    Past Surgical History:  Procedure Laterality Date   CHOLECYSTECTOMY     KNEE SURGERY     SHOULDER SURGERY     Social History   Socioeconomic History   Marital status: Married    Spouse name: Not on file   Number of children: Not on file   Years of education: Not on file   Highest education level: Not on file  Occupational History   Not on file  Tobacco Use   Smoking status: Every Day    Packs/day: 1    Types: Cigarettes   Smokeless tobacco: Never  Substance and Sexual Activity   Alcohol use: Yes   Drug use: No   Sexual activity: Not on file  Other Topics Concern   Not on file  Social History Narrative   Not on file   Social Determinants of Health   Financial Resource Strain: Not on file  Food Insecurity: Not on file  Transportation Needs: Not on file  Physical Activity: Not on file   Stress: Not on file  Social Connections: Not on file   Allergies  Allergen Reactions   Azithromycin Tinitus   Fosamax [Alendronate] Other (See Comments)    Body aches   No family history on file.  Current Outpatient Medications (Endocrine & Metabolic):    predniSONE (DELTASONE) 20 MG tablet, Take 1 tablet (20 mg total) by mouth daily with breakfast.      Current Outpatient Medications (Other):    valACYclovir (VALTREX) 1000 MG tablet, Take 1,000 mg by mouth.   Reviewed prior external information including notes and imaging from  primary care provider As well as notes that were available from care everywhere and other healthcare systems.  Past medical history, social, surgical and family history all reviewed in electronic medical record.  No pertanent information unless stated regarding to the chief complaint.   Review of Systems:  No headache, visual changes, nausea, vomiting, diarrhea, constipation, dizziness, abdominal pain, skin rash, fevers, chills, night sweats, weight loss, swollen lymph nodes, body aches, joint swelling, chest pain, shortness of breath, mood changes. POSITIVE muscle aches  Objective  Blood pressure 118/72, pulse 74, height 5\' 10"  (1.778 m), weight 140 lb (63.5 kg), SpO2 98 %.   General: No apparent distress alert and oriented x3 mood and affect normal, dressed  appropriately.  HEENT: Pupils equal, extraocular movements intact  Respiratory: Patient's speak in full sentences and does not appear short of breath  Cardiovascular: No lower extremity edema, non tender, no erythema  Patient does have some arthritic changes noted, crepitus noted of the knee.  Tender to palpation over the patellofemoral joint and the medial joint line.  Limited muscular skeletal ultrasound was performed and interpreted by Hulan Saas, M  Limited ultrasound shows the patient does have narrowing of the patellofemoral joint.  Patient does have no meniscal cyst noted.  Still  with degenerative changes of the cyst noted. Impression: Knee arthritis  After informed written and verbal consent, patient was seated on exam table. Left knee was prepped with alcohol swab and utilizing anterolateral approach, patient's left knee space was injected with 4:1  marcaine 0.5%: Kenalog 40mg /dL. Patient tolerated the procedure well without immediate complications.    Impression and Recommendations:

## 2022-10-19 ENCOUNTER — Encounter: Payer: Self-pay | Admitting: Family Medicine

## 2022-10-19 ENCOUNTER — Ambulatory Visit: Payer: 59 | Admitting: Family Medicine

## 2022-10-19 ENCOUNTER — Other Ambulatory Visit: Payer: Self-pay

## 2022-10-19 VITALS — BP 118/72 | HR 74 | Ht 70.0 in | Wt 140.0 lb

## 2022-10-19 DIAGNOSIS — M25561 Pain in right knee: Secondary | ICD-10-CM | POA: Diagnosis not present

## 2022-10-19 DIAGNOSIS — M1712 Unilateral primary osteoarthritis, left knee: Secondary | ICD-10-CM | POA: Insufficient documentation

## 2022-10-19 MED ORDER — PREDNISONE 20 MG PO TABS: 20.0000 mg | ORAL_TABLET | Freq: Every day | ORAL | 0 refills | Status: DC

## 2022-10-19 NOTE — Assessment & Plan Note (Signed)
Patient given injection today, did not see the parameniscal cyst that we saw previously.  Discussed with patient about icing regimen and home exercises, recommended no sign of the cyst for aspiration today.  Patient will be traveling and giving some prednisone to take with her traveling internationally.  Follow-up again in 2 months

## 2022-10-19 NOTE — Patient Instructions (Signed)
Injected knee today Prednisone 20mg   See me again in 2 months

## 2022-11-03 ENCOUNTER — Ambulatory Visit: Payer: 59 | Admitting: Family Medicine

## 2022-11-22 ENCOUNTER — Ambulatory Visit: Payer: 59 | Admitting: Family Medicine

## 2022-12-22 ENCOUNTER — Ambulatory Visit: Payer: 59 | Admitting: Family Medicine

## 2022-12-28 ENCOUNTER — Encounter: Payer: Self-pay | Admitting: Infectious Diseases

## 2022-12-28 MED ORDER — TRIAMCINOLONE ACETONIDE 0.5 % EX CREA: 1.0000 | TOPICAL_CREAM | Freq: Three times a day (TID) | CUTANEOUS | 2 refills | Status: AC

## 2023-01-23 ENCOUNTER — Ambulatory Visit: Payer: No Typology Code available for payment source

## 2023-01-23 ENCOUNTER — Ambulatory Visit: Payer: No Typology Code available for payment source | Admitting: Podiatry

## 2023-01-23 DIAGNOSIS — M79671 Pain in right foot: Secondary | ICD-10-CM

## 2023-01-23 DIAGNOSIS — S92501A Displaced unspecified fracture of right lesser toe(s), initial encounter for closed fracture: Secondary | ICD-10-CM | POA: Diagnosis not present

## 2023-01-23 DIAGNOSIS — Z87768 Personal history of other specified (corrected) congenital malformations of integument, limbs and musculoskeletal system: Secondary | ICD-10-CM

## 2023-01-23 MED ORDER — HYDROCODONE-ACETAMINOPHEN 5-325 MG PO TABS: 1.0000 | ORAL_TABLET | ORAL | 0 refills | Status: DC | PRN

## 2023-01-23 MED ORDER — IBUPROFEN 800 MG PO TABS: 800.0000 mg | ORAL_TABLET | Freq: Three times a day (TID) | ORAL | 1 refills | Status: AC

## 2023-01-31 NOTE — Progress Notes (Signed)
   Chief Complaint  Patient presents with   Foot Injury    Patient came in today for a right foot 5th toe injury, X-Rays taken today     HPI: 62 y.o. female presenting today as a reestablish new patient for evaluation of pain and tenderness associated to the right fifth digit that occurred this morning.  Patient states that she injured her fifth toe at home.  It is now malaligned.  She presents for further treatment and evaluation  Past Medical History:  Diagnosis Date   COVID-19 virus infection 08/2020   Otitis media    Peripheral edema    Sinusitis    Tobacco abuse    Vapes nicotine containing substance     Past Surgical History:  Procedure Laterality Date   CHOLECYSTECTOMY     KNEE SURGERY     SHOULDER SURGERY      Allergies  Allergen Reactions   Azithromycin Tinitus   Fosamax [Alendronate] Other (See Comments)    Body aches     Physical Exam: General: The patient is alert and oriented x3 in no acute distress.  Dermatology: Skin is warm, dry and supple bilateral lower extremities.   Vascular: Palpable pedal pulses bilaterally. Capillary refill within normal limits.  No appreciable edema.  No erythema.  Neurological: Grossly intact via light touch  Musculoskeletal Exam: Upon presentation there is some slight malalignment of the fifth digit of the right foot.  Postreduction the toe is in the better rectus alignment.  There is associated tenderness with palpation as well  Radiographic Exam RT foot 01/31/2023:  Normal osseous mineralization.  Transverse fracture of the neck of the proximal phalanx noted to the fifth digit minimally displaced.  Postreduction x-rays do demonstrate some better alignment of the fracture in the proximal phalanx.  Assessment/Plan of Care: 1.  Minimally displaced fracture at the neck of the proximal phalanx right fifth toe  -Patient evaluated.  X-rays reviewed.  Recommend conservative treatment -Closed reduction of the right fifth toe was  achieved today and the patient tolerated this well.  It was distracted and buddy splinted to the adjacent digits.  Postreduction x-rays were taken which demonstrated some better alignment. -Postsurgical shoe dispensed.  WBAT -Prescription for hydrocodone 5/325 mg sent to pharmacy -Prescription for Motrin 800 mg TID -Recommend rest and elevation with ice especially over the next 48 hours -Return to clinic 6 weeks follow-up x-ray       Felecia Shelling, DPM [facility]  Dr. Felecia Shelling, DPM    [address] [place], [place] [address]                Office [phone]  Fax [phone]

## 2023-02-22 ENCOUNTER — Ambulatory Visit: Payer: No Typology Code available for payment source | Admitting: Podiatry

## 2023-02-22 ENCOUNTER — Ambulatory Visit: Payer: No Typology Code available for payment source

## 2023-02-22 ENCOUNTER — Ambulatory Visit (INDEPENDENT_AMBULATORY_CARE_PROVIDER_SITE_OTHER): Payer: No Typology Code available for payment source

## 2023-02-22 ENCOUNTER — Encounter: Payer: Self-pay | Admitting: Podiatry

## 2023-02-22 VITALS — BP 131/77 | HR 68 | Ht 70.0 in

## 2023-02-22 DIAGNOSIS — S92502D Displaced unspecified fracture of left lesser toe(s), subsequent encounter for fracture with routine healing: Secondary | ICD-10-CM

## 2023-02-22 DIAGNOSIS — S92501A Displaced unspecified fracture of right lesser toe(s), initial encounter for closed fracture: Secondary | ICD-10-CM

## 2023-02-22 NOTE — Progress Notes (Signed)
   No chief complaint on file.   HPI: 62 y.o. female presenting today for follow-up evaluation of a fracture that the patient sustained to the right fifth toe.  Patient has noticed improvement.  Currently WBAT in the surgical shoe as instructed.  No new complaints  Past Medical History:  Diagnosis Date   COVID-19 virus infection 08/2020   Otitis media    Peripheral edema    Sinusitis    Tobacco abuse    Vapes nicotine containing substance     Past Surgical History:  Procedure Laterality Date   CHOLECYSTECTOMY     KNEE SURGERY     SHOULDER SURGERY      Allergies  Allergen Reactions   Azithromycin Tinitus   Fosamax [Alendronate] Other (See Comments)    Body aches     Physical Exam: General: The patient is alert and oriented x3 in no acute distress.  Dermatology: Skin is warm, dry and supple bilateral lower extremities.   Vascular: Palpable pedal pulses bilaterally. Capillary refill within normal limits.  No appreciable edema.  No erythema.  Neurological: Grossly intact via light touch  Musculoskeletal Exam: The toe is in rectus alignment.  There continues to be some ecchymosis and edema around the fifth toe.  Significantly improved pain with palpation  Radiographic Exam RT foot 01/31/2023:  Normal osseous mineralization.  Transverse fracture of the neck of the proximal phalanx noted to the fifth digit minimally displaced.  Postreduction x-rays do demonstrate some better alignment of the fracture in the proximal phalanx.  Radiographic exam RT foot 02/22/2023: Fracture appears stable with some callus formation around the area demonstrating good potential for healing.  Overall the toe is in rectus alignment  Assessment/Plan of Care: 1.  Minimally displaced fracture at the neck of the proximal phalanx right fifth toe with routine healing  -Patient evaluated.  X-rays reviewed.   -Patient has minimal pain associated to the toe.  She may now slowly transition out of the  postsurgical shoe into good supportive tennis shoes that are wide in the toebox area and do not constrict the toe over the next 4 weeks -Slowly increase activity over the next 4 to 6 weeks. -Return to clinic as needed       Felecia Shelling, DPM [facility]  Dr. Felecia Shelling, DPM    [address] [place], [place] [address]                Office [phone]  Fax [phone]

## 2023-03-31 ENCOUNTER — Other Ambulatory Visit: Payer: Self-pay | Admitting: Medical Genetics

## 2023-03-31 DIAGNOSIS — Z006 Encounter for examination for normal comparison and control in clinical research program: Secondary | ICD-10-CM

## 2023-04-05 ENCOUNTER — Other Ambulatory Visit (HOSPITAL_COMMUNITY)
Admission: RE | Admit: 2023-04-05 | Discharge: 2023-04-05 | Disposition: A | Discharge status: Home / Self Care | Payer: 59 | Source: Other Acute Inpatient Hospital | Attending: Oncology | Admitting: Oncology

## 2023-04-05 DIAGNOSIS — Z006 Encounter for examination for normal comparison and control in clinical research program: Secondary | ICD-10-CM | POA: Insufficient documentation

## 2023-04-15 DIAGNOSIS — J300 Vasomotor rhinitis: Secondary | ICD-10-CM | POA: Insufficient documentation

## 2023-04-15 DIAGNOSIS — H1045 Other chronic allergic conjunctivitis: Secondary | ICD-10-CM | POA: Insufficient documentation

## 2023-04-15 DIAGNOSIS — L501 Idiopathic urticaria: Secondary | ICD-10-CM | POA: Insufficient documentation

## 2023-05-05 LAB — HELIX MOLECULAR SCREEN: Genetic Analysis Overall Interpretation: NEGATIVE

## 2023-09-05 ENCOUNTER — Ambulatory Visit: Payer: Medicare Other | Admitting: Podiatry

## 2023-09-12 ENCOUNTER — Encounter: Payer: Self-pay | Admitting: Podiatry

## 2023-09-12 ENCOUNTER — Ambulatory Visit (INDEPENDENT_AMBULATORY_CARE_PROVIDER_SITE_OTHER): Payer: Medicare Other

## 2023-09-12 ENCOUNTER — Ambulatory Visit (INDEPENDENT_AMBULATORY_CARE_PROVIDER_SITE_OTHER): Payer: Medicare Other | Admitting: Podiatry

## 2023-09-12 DIAGNOSIS — G5781 Other specified mononeuropathies of right lower limb: Secondary | ICD-10-CM

## 2023-09-12 DIAGNOSIS — M2041 Other hammer toe(s) (acquired), right foot: Secondary | ICD-10-CM

## 2023-09-12 DIAGNOSIS — M2040 Other hammer toe(s) (acquired), unspecified foot: Secondary | ICD-10-CM

## 2023-09-12 DIAGNOSIS — M76821 Posterior tibial tendinitis, right leg: Secondary | ICD-10-CM

## 2023-09-12 DIAGNOSIS — I890 Lymphedema, not elsewhere classified: Secondary | ICD-10-CM

## 2023-09-12 MED ORDER — TRIAMCINOLONE ACETONIDE 40 MG/ML IJ SUSP
20.0000 mg | Freq: Once | INTRAMUSCULAR | Status: AC
  Administered 2023-09-12: 20 mg

## 2023-09-12 NOTE — Progress Notes (Addendum)
 Subjective:  Patient ID: Kimberly Hoover, female    DOB: September 06, 1960,  MRN: [id number] HPI Chief Complaint  Patient presents with   Toe Pain    3rd, 4th, 5th toes right - fracture 5th toe last summer, feels like it hasn't been right since, now notices the last 3 toes are aching   Foot Pain    Medial foot right - history of PT tendon repair left, starting to feel achy with the right foot and wonders if there is some preventative measures to keep tendon from worsening    63 y.o. female presents with the above complaint.   ROS: Denies fever chills nausea vomit muscle aches pains calf pain back pain chest pain shortness of breath.  Past Medical History:  Diagnosis Date   COVID-19 virus infection 08/2020   Otitis media    Peripheral edema    Sinusitis    Tobacco abuse    Vapes nicotine containing substance    Past Surgical History:  Procedure Laterality Date   CHOLECYSTECTOMY     KNEE SURGERY     SHOULDER SURGERY      Current Outpatient Medications:    ibuprofen  (ADVIL ) 800 MG tablet, Take 1 tablet (800 mg total) by mouth 3 (three) times daily., Disp: 60 tablet, Rfl: 1   triamcinolone  cream (KENALOG ) 0.5 %, Apply 1 Application topically 3 (three) times daily., Disp: 30 g, Rfl: 2   valACYclovir (VALTREX) 1000 MG tablet, Take 1,000 mg by mouth., Disp: , Rfl:   Allergies  Allergen Reactions   Azithromycin Tinitus   Fosamax [Alendronate] Other (See Comments)    Body aches   Review of Systems Objective:  There were no vitals filed for this visit.  General: Well developed, nourished, in no acute distress, alert and oriented x3   Dermatological: Skin is warm, dry and supple bilateral. Nails x 10 are well maintained; remaining integument appears unremarkable at this time. There are no open sores, no preulcerative lesions, no rash or signs of infection present.  Vascular: Dorsalis Pedis artery and Posterior Tibial artery pedal pulses are 2/4 bilateral with immedate capillary fill  time. Pedal hair growth present. No varicosities and no lower extremity edema present bilateral.   Neruologic: Grossly intact via light touch bilateral. Vibratory intact via tuning fork bilateral. Protective threshold with Semmes Wienstein monofilament intact to all pedal sites bilateral. Patellar and Achilles deep tendon reflexes 2+ bilateral. No Babinski or clonus noted bilateral.  Palpable Mulder's click to the third interdigital space of the right foot.  Musculoskeletal: No gross boney pedal deformities bilateral. No pain, crepitus, or limitation noted with foot and ankle range of motion bilateral. Muscular strength 5/5 in all groups tested bilateral.  Mild semirigid hammertoe deformities toes 2 through 5 of the right foot.  Palpable Mulder's click third and interspace right foot.  Right foot also demonstrates some tenderness along the course of the posterior tibial tendon.  Bilateral lower extremity does demonstrate edematous process or lymphedema.  Moderate in nature.  Gait: Unassisted, Nonantalgic.    Radiographs:  Radiographs taken today demonstrate osseously mature individual good bone mineralization she does have some cystic formation along the navicular tuberosity and there is some soft tissue swelling along the course of the tendon.  This is concerning for possible posterior tibial tendinitis and chronic traction of the posterior tibial tendon on the navicular tuberosity.  Radiographs also demonstrate an old fracture that is gone on to heal proximal phalanx fifth digit right foot  Assessment & Plan:   Assessment:  Lymphedema bilateral lower extremity.  Neuroma third interspace right foot.  Hammertoe deformities right foot.  Healed fifth digit fracture right foot.  Posterior tibial tendinitis.  Plan: Discussed etiology pathology conservative surgical therapies at this point I am going to have her in for orthotic casting.  I am also going to get her an injection today with 10 mg Kenalog  5  mg Marcaine to the point of maximal tenderness third interdigital space right foot.  Follow-up with her once his orthotics come in.  Would recommend measured compression hose thigh-high.     Cortnie Ringel T. Brady, [place]

## 2023-10-12 ENCOUNTER — Ambulatory Visit: Payer: Medicare Other

## 2023-10-12 NOTE — Progress Notes (Signed)
 Orthotics   Patient was present and evaluated for Custom molded foot orthotics.  Patient has Pes Cavus foot structure hx of tendon Xfer Left foot, right Foot Morton's Neuroma and possible Left Morton's neuroma as well  Patient will benefit from CFO's to provide total contact to BIL MLA's helping to balance and distribute body weight more evenly across BIL feet helping to reduce plantar pressure and pain. Orthotic will also encourage FF / RF alignment  Patient was scanned today and will return for fitting upon receipt   Kimberly Hoover CPed, CFo, CFm

## 2023-11-14 ENCOUNTER — Ambulatory Visit

## 2023-11-14 DIAGNOSIS — M2142 Flat foot [pes planus] (acquired), left foot: Secondary | ICD-10-CM | POA: Diagnosis not present

## 2023-11-14 DIAGNOSIS — M2141 Flat foot [pes planus] (acquired), right foot: Secondary | ICD-10-CM

## 2023-11-14 DIAGNOSIS — S92501A Displaced unspecified fracture of right lesser toe(s), initial encounter for closed fracture: Secondary | ICD-10-CM

## 2023-11-14 DIAGNOSIS — G5781 Other specified mononeuropathies of right lower limb: Secondary | ICD-10-CM | POA: Diagnosis not present

## 2023-11-14 DIAGNOSIS — M2041 Other hammer toe(s) (acquired), right foot: Secondary | ICD-10-CM

## 2023-11-14 DIAGNOSIS — M76821 Posterior tibial tendinitis, right leg: Secondary | ICD-10-CM

## 2023-11-14 NOTE — Progress Notes (Signed)
 Patient presents today to pick up custom molded foot orthotics, diagnosed with Pes Planus Hammertoes, pain and HX of toe fracture  by Dr. Lara Plants.   Orthotics were dispensed and fit was satisfactory. Reviewed instructions for break-in and wear. Written instructions given to patient.  Patient will follow up as needed.   Britton Cane CPed, CFo, CFm

## 2023-12-27 NOTE — Progress Notes (Unsigned)
 [facility] [address] [place] [address] Phone: [phone] Subjective:   IBryan Caprio, am serving as a scribe for Dr. Ronnell Coins.  I'm seeing this patient by the request  of:  Alfredia Ina, MD  CC: Right knee pain and swelling  LKG:[id number]  Kimberly Hoover is a 63 y.o. female coming in with complaint of R knee pain. Last seen in March 2024 for R knee pain.  Has been seen for both knees over the years but has been quite sometime.  Patient states started gyming in Jan and started to feel pain. Walking in [place] also made it swollen. No clicking or popping. Pain and tenderness.  Xray R knee December 2023 IMPRESSION: No acute finding.   Mild osteoarthritis.    Past Medical History:  Diagnosis Date   COVID-19 virus infection 08/2020   Otitis media    Peripheral edema    Sinusitis    Tobacco abuse    Vapes nicotine containing substance    Past Surgical History:  Procedure Laterality Date   CHOLECYSTECTOMY     KNEE SURGERY     SHOULDER SURGERY     Social History   Socioeconomic History   Marital status: Married    Spouse name: Not on file   Number of children: Not on file   Years of education: Not on file   Highest education level: Not on file  Occupational History   Not on file  Tobacco Use   Smoking status: Every Day    Current packs/day: 1.00    Types: Cigarettes   Smokeless tobacco: Never  Substance and Sexual Activity   Alcohol use: Yes   Drug use: No   Sexual activity: Not on file  Other Topics Concern   Not on file  Social History Narrative   Not on file   Social Drivers of Health   Financial Resource Strain: Low Risk  (04/18/2023)   Received from [facility]   Overall Financial Resource Strain (CARDIA)    Difficulty of Paying Living Expenses: Not hard at all  Food Insecurity: No Food Insecurity (04/18/2023)   Received from [facility]   Hunger Vital Sign    Worried About Running Out of Food in the  Last Year: Never true    Ran Out of Food in the Last Year: Never true  Transportation Needs: No Transportation Needs (04/18/2023)   Received from [facility] - Transportation    Lack of Transportation (Medical): No    Lack of Transportation (Non-Medical): No  Physical Activity: Sufficiently Active (04/18/2023)   Received from [facility]   Exercise Vital Sign    Days of Exercise per Week: 7 days    Minutes of Exercise per Session: 60 min  Stress: No Stress Concern Present (04/18/2023)   Received from [facility] - Occupational Stress Questionnaire    Feeling of Stress : Not at all  Social Connections: Socially Integrated (04/18/2023)   Received from [facility]   Social Network    How would you rate your social network (family, work, friends)?: Good participation with social networks   Allergies  Allergen Reactions   Azithromycin Tinitus   Fosamax [Alendronate] Other (See Comments)    Body aches   No family history on file.     Current Outpatient Medications (Analgesics):    ibuprofen  (ADVIL ) 800 MG tablet, Take 1 tablet (800 mg total) by mouth 3 (three) times  daily.   Current Outpatient Medications (Other):    triamcinolone  cream (KENALOG ) 0.5 %, Apply 1 Application topically 3 (three) times daily.   valACYclovir (VALTREX) 1000 MG tablet, Take 1,000 mg by mouth.   Reviewed prior external information including notes and imaging from  primary care provider As well as notes that were available from care everywhere and other healthcare systems.  Past medical history, social, surgical and family history all reviewed in electronic medical record.  No pertanent information unless stated regarding to the chief complaint.   Review of Systems:  No headache, visual changes, nausea, vomiting, diarrhea, constipation, dizziness, abdominal pain, skin rash, fevers, chills, night sweats, weight loss, swollen lymph nodes, body  aches, joint swelling, chest pain, shortness of breath, mood changes. POSITIVE muscle aches  Objective  Blood pressure 110/72, pulse 63, height 5\' 10"  (1.778 m), weight 140 lb (63.5 kg), SpO2 98%.   General: No apparent distress alert and oriented x3 mood and affect normal, dressed appropriately.  HEENT: Pupils equal, extraocular movements intact  Respiratory: Patient's speak in full sentences and does not appear short of breath  Cardiovascular: No lower extremity edema, non tender, no erythema  Knee exam shows does have some arthritic changes noted.  Trace effusion noted of the right knee compared to the left.  Tightness noted in multiple different areas.  Limited muscular skeletal ultrasound was performed and interpreted by Ronnell Coins, M  Limited ultrasound shows the patient does have persistent trace effusion noted of the right knee.  Patient does have narrowing noted of the medial joint space.  Lateral meniscus does have what appears to be a possible parameniscal cyst.  After informed written and verbal consent, patient was seated on exam table. Right knee was prepped with alcohol swab and utilizing anterolateral approach, patient's right knee space was injected with 4:1  marcaine 0.5%: Kenalog  40mg /dL. Patient tolerated the procedure well without immediate complications.    Impression and Recommendations:     The above documentation has been reviewed and is accurate and complete Kimberly Hoover M Kimberly Smoot, DO

## 2023-12-28 ENCOUNTER — Telehealth: Payer: Self-pay

## 2023-12-28 ENCOUNTER — Ambulatory Visit (INDEPENDENT_AMBULATORY_CARE_PROVIDER_SITE_OTHER)

## 2023-12-28 ENCOUNTER — Encounter: Payer: Self-pay | Admitting: Family Medicine

## 2023-12-28 ENCOUNTER — Ambulatory Visit: Admitting: Family Medicine

## 2023-12-28 ENCOUNTER — Other Ambulatory Visit: Payer: Self-pay

## 2023-12-28 VITALS — BP 110/72 | HR 63 | Ht 70.0 in | Wt 140.0 lb

## 2023-12-28 DIAGNOSIS — M1711 Unilateral primary osteoarthritis, right knee: Secondary | ICD-10-CM

## 2023-12-28 NOTE — Assessment & Plan Note (Signed)
 Has been doing relatively well but unfortunately having more discomfort in the knees.  Has had difficulty bilaterally.  This time when the right knee had the effusion.  Responded well to injection.  Could be a candidate for viscosupplementation.  Increase activity slowly.  Follow-up again in 6 to 8 weeks

## 2023-12-28 NOTE — Telephone Encounter (Signed)
 Ran for durolane 12/28/23. Case number: [id number]. Pending approval.

## 2023-12-28 NOTE — Patient Instructions (Addendum)
 Injection in knee today Good to see you! Xrays today You have 14 days to return or exchange your brace Call [phone], then return the brace to our office Do prescribed exercises at least 3x a week See you again in 6 weeks

## 2024-01-01 ENCOUNTER — Telehealth: Payer: Self-pay

## 2024-01-01 NOTE — Telephone Encounter (Signed)
 Patient needs an appointment once medication is in stock.   Durolane approved for right knee.  Patient has a Fully-Funded Medicare Advantage PPO Calendar Year plan with an effective date of 10/31/2023. Plan follows Medicare guidelines. Specialist office visits, Durolane [phone] and procedures 20610/20611 are covered at 100% of the allowable. No pre-cert and no referrals needed. Medical notes may be requested at the time of claims processing.  Ref #: [id number] Case number: [id number] Exp: 06/30/2024

## 2024-01-01 NOTE — Telephone Encounter (Signed)
 Approved and documented in FYI and in another phone note.

## 2024-01-02 ENCOUNTER — Ambulatory Visit: Payer: Self-pay | Admitting: Family Medicine

## 2024-01-02 ENCOUNTER — Ambulatory Visit: Admitting: Family Medicine

## 2024-01-02 NOTE — Telephone Encounter (Signed)
 Noted

## 2024-02-13 ENCOUNTER — Ambulatory Visit: Admitting: Family Medicine

## 2024-04-24 ENCOUNTER — Encounter: Payer: Self-pay | Admitting: Podiatry

## 2024-04-26 ENCOUNTER — Other Ambulatory Visit: Payer: Self-pay

## 2024-04-26 DIAGNOSIS — R600 Localized edema: Secondary | ICD-10-CM

## 2024-04-26 DIAGNOSIS — I872 Venous insufficiency (chronic) (peripheral): Secondary | ICD-10-CM

## 2024-04-26 DIAGNOSIS — I890 Lymphedema, not elsewhere classified: Secondary | ICD-10-CM

## 2024-05-01 ENCOUNTER — Other Ambulatory Visit: Payer: Self-pay

## 2024-05-01 DIAGNOSIS — I890 Lymphedema, not elsewhere classified: Secondary | ICD-10-CM

## 2024-05-01 DIAGNOSIS — I872 Venous insufficiency (chronic) (peripheral): Secondary | ICD-10-CM

## 2024-05-01 DIAGNOSIS — R600 Localized edema: Secondary | ICD-10-CM
